# Patient Record
Sex: Female | Born: 1980 | Race: Black or African American | Hispanic: No | Marital: Married | Smoking: Never smoker
Health system: Southern US, Community
[De-identification: ages and names within clinical notes are randomized; demographics above are authoritative.]

## PROBLEM LIST (undated history)

## (undated) DIAGNOSIS — F419 Anxiety disorder, unspecified: Secondary | ICD-10-CM

## (undated) DIAGNOSIS — Z789 Other specified health status: Secondary | ICD-10-CM

## (undated) HISTORY — PX: TONSILLECTOMY: SUR1361

## (undated) HISTORY — PX: WISDOM TOOTH EXTRACTION: SHX21

---

## 1999-02-25 ENCOUNTER — Encounter: Payer: Self-pay | Admitting: Emergency Medicine

## 1999-02-25 ENCOUNTER — Emergency Department (HOSPITAL_COMMUNITY): Admission: EM | Admit: 1999-02-25 | Discharge: 1999-02-25 | Payer: Self-pay | Admitting: Emergency Medicine

## 2000-08-18 ENCOUNTER — Other Ambulatory Visit: Admission: RE | Admit: 2000-08-18 | Discharge: 2000-08-18 | Payer: Self-pay | Admitting: Family Medicine

## 2001-08-17 ENCOUNTER — Emergency Department (HOSPITAL_COMMUNITY): Admission: EM | Admit: 2001-08-17 | Discharge: 2001-08-17 | Payer: Self-pay | Admitting: Emergency Medicine

## 2004-04-23 ENCOUNTER — Ambulatory Visit: Payer: Self-pay | Admitting: Family Medicine

## 2007-02-02 ENCOUNTER — Encounter: Payer: Self-pay | Admitting: Family Medicine

## 2007-02-17 ENCOUNTER — Ambulatory Visit: Payer: Self-pay | Admitting: Family Medicine

## 2007-02-18 ENCOUNTER — Encounter: Payer: Self-pay | Admitting: Family Medicine

## 2007-02-18 LAB — CONVERTED CEMR LAB
BUN: 8 mg/dL (ref 6–23)
Basophils Absolute: 0 10*3/uL (ref 0.0–0.1)
Basophils Relative: 1 % (ref 0–1)
CO2: 22 meq/L (ref 19–32)
Calcium: 9.2 mg/dL (ref 8.4–10.5)
Chloride: 104 meq/L (ref 96–112)
Cholesterol: 149 mg/dL (ref 0–200)
Creatinine, Ser: 0.73 mg/dL (ref 0.40–1.20)
Eosinophils Absolute: 0.2 10*3/uL (ref 0.0–0.7)
Eosinophils Relative: 5 % (ref 0–5)
Glucose, Bld: 87 mg/dL (ref 70–99)
HCT: 40.9 % (ref 36.0–46.0)
HDL: 57 mg/dL (ref >39)
Hemoglobin: 13.7 g/dL (ref 12.0–15.0)
LDL Cholesterol: 84 mg/dL (ref 0–99)
Lymphocytes Relative: 53 % — ABNORMAL HIGH (ref 12–46)
Lymphs Abs: 2.1 10*3/uL (ref 0.7–4.0)
MCHC: 33.5 g/dL (ref 30.0–36.0)
MCV: 84.7 fL (ref 78.0–100.0)
Monocytes Absolute: 0.3 10*3/uL (ref 0.1–1.0)
Monocytes Relative: 8 % (ref 3–12)
Neutro Abs: 1.3 10*3/uL — ABNORMAL LOW (ref 1.7–7.7)
Neutrophils Relative %: 32 % — ABNORMAL LOW (ref 43–77)
Platelets: 245 10*3/uL (ref 150–400)
Potassium: 4.4 meq/L (ref 3.5–5.3)
RBC: 4.83 M/uL (ref 3.87–5.11)
RDW: 15.5 % (ref 11.5–15.5)
Sodium: 137 meq/L (ref 135–145)
Total CHOL/HDL Ratio: 2.6
Triglycerides: 39 mg/dL (ref <150)
VLDL: 8 mg/dL (ref 0–40)
WBC: 4 10*3/uL (ref 4.0–10.5)

## 2008-06-06 ENCOUNTER — Ambulatory Visit: Payer: Self-pay | Admitting: Family Medicine

## 2008-06-06 DIAGNOSIS — E669 Obesity, unspecified: Secondary | ICD-10-CM | POA: Insufficient documentation

## 2008-06-06 DIAGNOSIS — J019 Acute sinusitis, unspecified: Secondary | ICD-10-CM | POA: Insufficient documentation

## 2008-06-10 ENCOUNTER — Telehealth: Payer: Self-pay | Admitting: Family Medicine

## 2008-11-08 ENCOUNTER — Encounter: Payer: Self-pay | Admitting: Family Medicine

## 2008-11-12 ENCOUNTER — Other Ambulatory Visit: Payer: Self-pay | Admitting: Emergency Medicine

## 2008-11-12 ENCOUNTER — Inpatient Hospital Stay (HOSPITAL_COMMUNITY): Admission: AD | Admit: 2008-11-12 | Discharge: 2008-11-15 | Payer: Self-pay | Admitting: Obstetrics & Gynecology

## 2009-01-21 ENCOUNTER — Inpatient Hospital Stay (HOSPITAL_COMMUNITY): Admission: AD | Admit: 2009-01-21 | Discharge: 2009-01-21 | Payer: Self-pay | Admitting: Obstetrics and Gynecology

## 2009-02-02 ENCOUNTER — Inpatient Hospital Stay (HOSPITAL_COMMUNITY): Admission: AD | Admit: 2009-02-02 | Discharge: 2009-02-02 | Payer: Self-pay | Admitting: Obstetrics and Gynecology

## 2009-02-05 ENCOUNTER — Inpatient Hospital Stay (HOSPITAL_COMMUNITY): Admission: RE | Admit: 2009-02-05 | Discharge: 2009-02-07 | Payer: Self-pay | Admitting: Obstetrics & Gynecology

## 2010-02-15 ENCOUNTER — Encounter (HOSPITAL_COMMUNITY): Payer: Self-pay | Admitting: Obstetrics and Gynecology

## 2010-02-15 ENCOUNTER — Inpatient Hospital Stay (HOSPITAL_COMMUNITY)
Admission: AD | Admit: 2010-02-15 | Discharge: 2010-02-19 | Payer: Self-pay | Source: Home / Self Care | Attending: Obstetrics and Gynecology | Admitting: Obstetrics and Gynecology

## 2010-02-16 LAB — URINALYSIS, ROUTINE W REFLEX MICROSCOPIC
Bilirubin Urine: NEGATIVE
Ketones, ur: NEGATIVE mg/dL
Nitrite: NEGATIVE
Protein, ur: NEGATIVE mg/dL
Specific Gravity, Urine: 1.01 (ref 1.005–1.030)
Urine Glucose, Fasting: NEGATIVE mg/dL
Urobilinogen, UA: 0.2 mg/dL (ref 0.0–1.0)
pH: 7 (ref 5.0–8.0)

## 2010-02-16 LAB — CBC
HCT: 33.1 % — ABNORMAL LOW (ref 36.0–46.0)
Hemoglobin: 11.5 g/dL — ABNORMAL LOW (ref 12.0–15.0)
MCH: 30.3 pg (ref 26.0–34.0)
MCHC: 34.7 g/dL (ref 30.0–36.0)
MCV: 87.1 fL (ref 78.0–100.0)
Platelets: 222 10*3/uL (ref 150–400)
RBC: 3.8 MIL/uL — ABNORMAL LOW (ref 3.87–5.11)
RDW: 13.8 % (ref 11.5–15.5)
WBC: 9.2 10*3/uL (ref 4.0–10.5)

## 2010-02-16 LAB — URINE MICROSCOPIC-ADD ON

## 2010-02-16 LAB — RPR: RPR Ser Ql: NONREACTIVE

## 2010-02-18 LAB — CBC
HCT: 28.6 % — ABNORMAL LOW (ref 36.0–46.0)
HCT: 29.2 % — ABNORMAL LOW (ref 36.0–46.0)
Hemoglobin: 9.8 g/dL — ABNORMAL LOW (ref 12.0–15.0)
Hemoglobin: 9.8 g/dL — ABNORMAL LOW (ref 12.0–15.0)
MCH: 30.2 pg (ref 26.0–34.0)
MCH: 30.2 pg (ref 26.0–34.0)
MCHC: 34.3 g/dL (ref 30.0–36.0)
MCHC: 33.6 g/dL (ref 30.0–36.0)
MCV: 88 fL (ref 78.0–100.0)
MCV: 89.8 fL (ref 78.0–100.0)
Platelets: 213 10*3/uL (ref 150–400)
Platelets: 184 10*3/uL (ref 150–400)
RBC: 3.25 MIL/uL — ABNORMAL LOW (ref 3.87–5.11)
RBC: 3.25 MIL/uL — ABNORMAL LOW (ref 3.87–5.11)
RDW: 13.9 % (ref 11.5–15.5)
RDW: 14.2 % (ref 11.5–15.5)
WBC: 14.9 10*3/uL — ABNORMAL HIGH (ref 4.0–10.5)
WBC: 9.5 10*3/uL (ref 4.0–10.5)

## 2010-03-03 NOTE — Letter (Signed)
Summary: DEMO  DEMO   Imported By: Lind Guest 07/04/2009 08:26:27  _____________________________________________________________________  External Attachment:    Type:   Image     Comment:   External Document

## 2010-03-03 NOTE — Letter (Signed)
Summary: CONSULTS  CONSULTS   Imported By: Lind Guest 07/04/2009 08:25:44  _____________________________________________________________________  External Attachment:    Type:   Image     Comment:   External Document

## 2010-03-03 NOTE — Letter (Signed)
Summary: PHONE NOTES  PHONE NOTES   Imported By: Lind Guest 07/04/2009 08:27:59  _____________________________________________________________________  External Attachment:    Type:   Image     Comment:   External Document

## 2010-03-03 NOTE — Progress Notes (Signed)
Summary: medicine  Phone Note Call from Patient   Summary of Call: still no better. can doc call her in something else. [phone]  [facility] Initial call taken by: Rudene Anda,  Jun 10, 2008 4:14 PM  Follow-up for Phone Call        penicillin was prescribed for her sinuses, is she having green drainage, fever etc what are her symptoms.If she really feels as tho there is nO improvement an alternate abiotic eg septra ds Take 1 tablet by mouth two times a day #20 can be sent in , stop the pcn. she needs to submit sputum  for c/s also since she states no better.if she reports fever needs cbc and diff also I tried to speak with her but could get no answer Follow-up by: Syliva Overman MD,  Jun 10, 2008 6:16 PM  Additional Follow-up for Phone Call Additional follow up Details #1::        called patient, no answer Additional Follow-up by: Worthy Keeler LPN,  Jun 11, 2008 9:28 AM    Additional Follow-up for Phone Call Additional follow up Details #2::    really hoarse, chest congestion, cant get anything up, lungs hurt when she breathes, no fever Follow-up by: Worthy Keeler LPN,  Jun 11, 2008 1:58 PM  Additional Follow-up for Phone Call Additional follow up Details #3:: Details for Additional Follow-up Action Taken: [pls erx  and advise prednisone 5 mg dose pack x 6 days only and septra as previously discussed, offer cxr also pls Additional Follow-up by: Syliva Overman MD,  Jun 11, 2008 3:50 PM  New/Updated Medications: SEPTRA DS 800-160 MG TABS (SULFAMETHOXAZOLE-TRIMETHOPRIM) one tab by mouth bid PREDNISONE (PAK) 5 MG TABS (PREDNISONE) uad   Prescriptions: PREDNISONE (PAK) 5 MG TABS (PREDNISONE) uad  #21 x 0   Entered by:   Worthy Keeler LPN   Authorized by:   Syliva Overman MD   Signed by:   Worthy Keeler LPN on 51/88/4166   Method used:   Electronically to        [facility]  [address] (retail)       [address]       [place], [place]  [address]       Ph:  [phone]       Fax: [phone]   RxID:   [id number] SEPTRA DS 800-160 MG TABS (SULFAMETHOXAZOLE-TRIMETHOPRIM) one tab by mouth bid  #20 x 0   Entered by:   Worthy Keeler LPN   Authorized by:   Syliva Overman MD   Signed by:   Worthy Keeler LPN on 07/37/1062   Method used:   Electronically to        [facility]  [address] (retail)       [address]       [place], [place]  [address]       Ph: [phone]       Fax: [phone]   RxID:   [id number]  rx sent, called patient, left message  Appended Document: medicine called patient, left message

## 2010-03-03 NOTE — Letter (Signed)
Summary: MISC  MISC   Imported By: Lind Guest 07/04/2009 08:27:37  _____________________________________________________________________  External Attachment:    Type:   Image     Comment:   External Document

## 2010-03-03 NOTE — Letter (Signed)
Summary: OFFICE NOTES  OFFICE NOTES   Imported By: Lind Guest 07/04/2009 08:28:47  _____________________________________________________________________  External Attachment:    Type:   Image     Comment:   External Document

## 2010-03-03 NOTE — Letter (Signed)
Summary: Out of Work  All     ,     Phone:   Fax:     Jun 06, 2008   Employee:  [name]    To Whom It May Concern:   For Medical reasons, please excuse the above named employee from work for the following dates:  Start:   06/06/08  End:   06/10/08  If you need additional information, please feel free to contact our office.         Sincerely,    Worthy Keeler LPN

## 2010-03-03 NOTE — Letter (Signed)
Summary: HISTORY AND PHYSICAL  HISTORY AND PHYSICAL   Imported By: Lind Guest 07/04/2009 08:26:54  _____________________________________________________________________  External Attachment:    Type:   Image     Comment:   External Document

## 2010-03-03 NOTE — Letter (Signed)
Summary: LABS  LABS   Imported By: Lind Guest 07/04/2009 08:27:13  _____________________________________________________________________  External Attachment:    Type:   Image     Comment:   External Document

## 2010-03-03 NOTE — Letter (Signed)
Summary: Letter  Letter   Imported By: Lind Guest 11/08/2008 16:40:18  _____________________________________________________________________  External Attachment:    Type:   Image     Comment:   External Document

## 2010-03-04 NOTE — Op Note (Signed)
[name], [name]                 ACCOUNT NO.:  [id number]  MEDICAL RECORD NO.:  [id number]          PATIENT TYPE:  INP  LOCATION:  9170                          [facility]ILITY:  [facility]  PHYSICIAN:  Zelphia Cairo, MD    DATE OF BIRTH:  16-Mar-1980  DATE OF PROCEDURE:  02/15/2010 DATE OF DISCHARGE:                              OPERATIVE REPORT   PREOPERATIVE DIAGNOSES: 1. Twin gestation at 35 plus [redacted] weeks gestation. 2. Preterm labor with advanced dilation.  PROCEDURE:  Classical cesarean section.  ANESTHESIA:  Spinal.  SURGEON:  Zelphia Cairo, MD.  Threasa HeadsShawnie Pons.  ESTIMATED BLOOD LOSS:  800 mL.  URINE OUTPUT:  Clear.  FINDINGS:  Viable infant x2.  Apgars are still pending.  There was also a 3-4 cm pedunculated fibroid noted on the left anterior uterus.  Urine output was clear.  COMPLICATIONS:  None.  CONDITION:  Stable to recovery room.  PROCEDURE:  The patient was taken to the operating room after informed consent was obtained.  She was placed on her side and spinal anesthesia was obtained.  She was then turned to the supine position with a left tilt, prepped and draped in sterile fashion.  A Pfannenstiel skin incision was made with a scalpel and extended to the level of the fascia.  The fascia was incised sharply and this was extended laterally using curved Mayo scissors.  Kocher clamps were used to grasp the superior and inferior portion of the fascia.  This was tented upwards and the underlying rectus muscles were dissected sharply and bluntly. Peritoneum was then identified and entered sharply with Metzenbaum scissors.  This was extended superiorly and inferiorly with good visualization of the bladder.  The bladder blade was then inserted.  A classical uterine incision was made using a scalpel, A was noted to be in the vertex position.  Membranes were ruptured for clear fluids and the infant was delivered using fundal pressure.  The cord was clamped and cut as  the mouth and nose were suctioned and the infant was taken to the awaiting NICU staff.  Baby B amniotic membranes were ruptured for clear fluid.  Infant was noted to be in the transverse position, but rotated to vertex and delivered using fundal pressure.  The mouth and nose were suctioned as the cord was clamped and cut and the infant was taken to the awaiting NICU staff.  Placentas were then manually removed from the uterus and the uterus was cleared of all clots and debris using a dry lap sponge.  The uterine incision was reapproximated using double layer closure of 0 chromic in a running locked fashion.  Once hemostasis was assured, the pelvis was copiously irrigated with warm normal saline. The uterine incision was reinspected and found to be hemostatic.  The peritoneum was then reapproximated with 0 Monocryl.  The fascia was closed with a looped 0 PDS and the skin was closed with staples.  Sponge lap, needle, and instrument counts were correct x2.  She was taken to the recovery room in stable condition.     Zelphia Cairo, MD    GA/MEDQ  D:  02/15/2010  T:  02/16/2010  Job:  [id number]  Electronically Signed by Zelphia Cairo MD on 03/04/2010 08:12:03 PM

## 2010-03-17 NOTE — Discharge Summary (Addendum)
NAMEGAVIN, [name]                 ACCOUNT NO.:  [id number]  MEDICAL RECORD NO.:  [id number]          PATIENT TYPE:  INP  LOCATION:  9319                          [facility]ILITY:  [facility]  PHYSICIAN:  Dineen Kid. Rana Snare, M.D.    DATE OF BIRTH:  Apr 07, 1980  DATE OF ADMISSION:  02/15/2010 DATE OF DISCHARGE:  02/19/2010                              DISCHARGE SUMMARY   ADMITTING DIAGNOSES: 1. Intrauterine pregnancy at 63 and 6 weeks' estimated gestational     age. 2. Twin gestation. 3. Spontaneous onset of labor with advanced cervical dilatation.  DISCHARGE DIAGNOSES: 1. Status post classical cesarean section. 2. Viable female and female infants.  PROCEDURE:  Primary classical cesarean delivery.  REASON FOR ADMISSION:  Please see written H and P.  HOSPITAL COURSE:  The patient is 30 year old gravida 5, para 1-0-3-1, that was admitted to [facility] at 25-6/7th weeks' estimated gestational age with a twin gestation with spontaneous onset of labor with advanced cervical dilatation.  On admission, vital signs were stable.  Fetal heart tones were reactive.  Contractions were approximately every 2-3 minutes.  Cervix was examined and found to be 6- cm dilated and 90% effaced with -1 station.  Ultrasound revealed baby A with vertex with a cord presentation and baby B in the transverse presentation.  The patient was now admitted, placed in Trendelenburg position, betamethasone was administered, and the patient was started on magnesium sulfate.  The patient was continued on the magnesium sulfate. Ampicillin was also given for prophylaxis regarding group B beta strep. Later that morning, the patient did complain of a gush of amniotic fluid, no vaginal bleeding.  Fetal heart tones in the 120s-140s x2. Cervix was examined and found to be 8 cm with a bulging bag of waters, no cord prolapse.  Decision was made to proceed with a primary cesarean delivery.  The patient was then transferred to  the operating room where spinal anesthesia was administered without difficulty.  A classical incision was made with delivery of a viable female infant with Apgars of 6 at 1 minute, 1 at 5 minutes, 5 at 10 minutes, and baby B boy baby with Apgars of 7 at 1 minute, 7 at 5 minutes, and 9 at 10 minutes.  Babies were both taken to the NICU in stable condition.  The patient was also known to have a 3-4 cm pedunculated fibroid.  The patient tolerated the procedure well and was taken to the recovery room in stable condition. On postoperative day #1, the patient was without complaint.  Vital signs were stable.  Fundus was firm and nontender.  Abdominal dressing was noted to be clean, dry, and intact.  Laboratory findings showed hemoglobin of 9.8.  On postoperative day #2, babies were stable in the NICU.  Vital signs were stable.  Abdomen was soft.  Fundus was firm and nontender.  Incision was clean, dry, and intact.  On postoperative day #3, the patient was without complaint.  Vital signs remained stable. Abdomen was soft.  Fundus was firm and nontender.  She was ambulating well.  On postoperative day #3, the patient was without  complaint. Babies continued to be stable.  Abdomen was soft.  Fundus was firm and nontender.  Incision was clean, dry, and intact.  Staples were removed and the patient was later discharged home.  CONDITION ON DISCHARGE:  Stable.  DIET:  Regular as tolerated.  ACTIVITY:  No heavy lifting, no driving x2 weeks, no vaginal entry.  FOLLOWUP:  The patient is to follow up in the office in 1-2 weeks for an incision check.  She is to call for temperature greater than 100 degrees, persistent nausea, vomiting, heavy vaginal bleeding, and/or redness or drainage from incisional site.  DISCHARGE MEDICATIONS: 1. Percocet 5/325 #30, 1 p.o. every 4-6 hours p.r.n. 2. Motrin 600 mg every 6 hours. 3. Prenatal vitamins 1 p.o. daily. 4. Colace 1 p.o. daily p.r.n.     [name],  N.P.   ______________________________ Dineen Kid Rana Snare, M.D.    CC/MEDQ  D:  02/19/2010  T:  02/19/2010  Job:  [id number]  Electronically Signed by [name] N.P. on 02/20/2010 08:54:36 AM Electronically Signed by Candice Camp M.D. on 03/17/2010 12:59:33 PM

## 2010-04-19 LAB — CBC
HCT: 29.3 % — ABNORMAL LOW (ref 36.0–46.0)
HCT: 30.8 % — ABNORMAL LOW (ref 36.0–46.0)
HCT: 33 % — ABNORMAL LOW (ref 36.0–46.0)
Hemoglobin: 10.1 g/dL — ABNORMAL LOW (ref 12.0–15.0)
Hemoglobin: 10.9 g/dL — ABNORMAL LOW (ref 12.0–15.0)
Hemoglobin: 9.8 g/dL — ABNORMAL LOW (ref 12.0–15.0)
MCHC: 33 g/dL (ref 30.0–36.0)
MCHC: 33.1 g/dL (ref 30.0–36.0)
MCHC: 33.3 g/dL (ref 30.0–36.0)
MCV: 86.7 fL (ref 78.0–100.0)
MCV: 87.5 fL (ref 78.0–100.0)
MCV: 87.9 fL (ref 78.0–100.0)
Platelets: 140 10*3/uL — ABNORMAL LOW (ref 150–400)
Platelets: 141 10*3/uL — ABNORMAL LOW (ref 150–400)
Platelets: 168 10*3/uL (ref 150–400)
RBC: 3.35 MIL/uL — ABNORMAL LOW (ref 3.87–5.11)
RBC: 3.5 MIL/uL — ABNORMAL LOW (ref 3.87–5.11)
RBC: 3.81 MIL/uL — ABNORMAL LOW (ref 3.87–5.11)
RDW: 13.2 % (ref 11.5–15.5)
RDW: 13.4 % (ref 11.5–15.5)
RDW: 13.5 % (ref 11.5–15.5)
WBC: 15.2 10*3/uL — ABNORMAL HIGH (ref 4.0–10.5)
WBC: 22.7 10*3/uL — ABNORMAL HIGH (ref 4.0–10.5)
WBC: 5.1 10*3/uL (ref 4.0–10.5)

## 2010-04-19 LAB — RPR: RPR Ser Ql: NONREACTIVE

## 2010-05-07 LAB — CBC
HCT: 34.5 % — ABNORMAL LOW (ref 36.0–46.0)
Hemoglobin: 12 g/dL (ref 12.0–15.0)
MCHC: 34.7 g/dL (ref 30.0–36.0)
MCV: 92.3 fL (ref 78.0–100.0)
Platelets: 181 10*3/uL (ref 150–400)
RBC: 3.74 MIL/uL — ABNORMAL LOW (ref 3.87–5.11)
RDW: 13 % (ref 11.5–15.5)
WBC: 7.1 10*3/uL (ref 4.0–10.5)

## 2010-05-07 LAB — URINALYSIS, ROUTINE W REFLEX MICROSCOPIC
Bilirubin Urine: NEGATIVE
Glucose, UA: NEGATIVE mg/dL
Ketones, ur: NEGATIVE mg/dL
Nitrite: NEGATIVE
Protein, ur: NEGATIVE mg/dL
Specific Gravity, Urine: 1.015 (ref 1.005–1.030)
Urobilinogen, UA: 0.2 mg/dL (ref 0.0–1.0)
pH: 7 (ref 5.0–8.0)

## 2010-05-07 LAB — WET PREP, GENITAL
Clue Cells Wet Prep HPF POC: NONE SEEN
Trich, Wet Prep: NONE SEEN
WBC, Wet Prep HPF POC: NONE SEEN
Yeast Wet Prep HPF POC: NONE SEEN

## 2010-05-07 LAB — DIFFERENTIAL
Basophils Absolute: 0.1 10*3/uL (ref 0.0–0.1)
Basophils Relative: 1 % (ref 0–1)
Eosinophils Absolute: 0.1 10*3/uL (ref 0.0–0.7)
Eosinophils Relative: 1 % (ref 0–5)
Lymphocytes Relative: 35 % (ref 12–46)
Lymphs Abs: 2.5 10*3/uL (ref 0.7–4.0)
Monocytes Absolute: 0.5 10*3/uL (ref 0.1–1.0)
Monocytes Relative: 7 % (ref 3–12)
Neutro Abs: 4 10*3/uL (ref 1.7–7.7)
Neutrophils Relative %: 56 % (ref 43–77)

## 2010-05-07 LAB — BASIC METABOLIC PANEL
BUN: 5 mg/dL — ABNORMAL LOW (ref 6–23)
CO2: 24 mEq/L (ref 19–32)
Calcium: 9 mg/dL (ref 8.4–10.5)
Chloride: 109 mEq/L (ref 96–112)
Creatinine, Ser: 0.6 mg/dL (ref 0.4–1.2)
GFR calc Af Amer: >60 mL/min (ref >60)
GFR calc non Af Amer: >60 mL/min (ref >60)
Glucose, Bld: 82 mg/dL (ref 70–99)
Potassium: 3.3 mEq/L — ABNORMAL LOW (ref 3.5–5.1)
Sodium: 136 mEq/L (ref 135–145)

## 2010-05-07 LAB — GC/CHLAMYDIA PROBE AMP, GENITAL
Chlamydia, DNA Probe: NEGATIVE
GC Probe Amp, Genital: NEGATIVE

## 2010-05-07 LAB — URINE MICROSCOPIC-ADD ON

## 2010-05-18 ENCOUNTER — Encounter: Payer: Self-pay | Admitting: Orthopedic Surgery

## 2010-05-20 ENCOUNTER — Encounter: Payer: Self-pay | Admitting: Orthopedic Surgery

## 2010-05-20 ENCOUNTER — Ambulatory Visit: Payer: Self-pay | Admitting: Orthopedic Surgery

## 2011-02-02 HISTORY — PX: ABDOMINAL HYSTERECTOMY: SHX81

## 2011-11-08 ENCOUNTER — Inpatient Hospital Stay (HOSPITAL_COMMUNITY): Admission: RE | Admit: 2011-11-08 | Discharge: 2011-11-08 | Payer: Self-pay | Source: Ambulatory Visit

## 2011-11-30 ENCOUNTER — Encounter (HOSPITAL_COMMUNITY): Payer: Self-pay | Admitting: Pharmacist

## 2011-12-06 ENCOUNTER — Encounter (HOSPITAL_COMMUNITY): Payer: Self-pay

## 2011-12-06 ENCOUNTER — Encounter (HOSPITAL_COMMUNITY)
Admission: RE | Admit: 2011-12-06 | Discharge: 2011-12-06 | Disposition: A | Discharge status: Home / Self Care | Payer: BC Managed Care – PPO | Source: Ambulatory Visit | Attending: Obstetrics and Gynecology | Admitting: Obstetrics and Gynecology

## 2011-12-06 HISTORY — DX: Other specified health status: Z78.9

## 2011-12-06 LAB — CBC
HCT: 38.6 % (ref 36.0–46.0)
Hemoglobin: 13 g/dL (ref 12.0–15.0)
MCH: 29.4 pg (ref 26.0–34.0)
MCHC: 33.7 g/dL (ref 30.0–36.0)
MCV: 87.3 fL (ref 78.0–100.0)
Platelets: 265 10*3/uL (ref 150–400)
RBC: 4.42 MIL/uL (ref 3.87–5.11)
RDW: 13.8 % (ref 11.5–15.5)
WBC: 6.3 10*3/uL (ref 4.0–10.5)

## 2011-12-06 LAB — SURGICAL PCR SCREEN
MRSA, PCR: NEGATIVE
Staphylococcus aureus: NEGATIVE

## 2011-12-06 NOTE — Patient Instructions (Addendum)
Your procedure is scheduled on:12/13/11  Enter through the Main Entrance at :6am Pick up desk phone and dial [phone] and inform us of your arrival.  Please call [phone] if you have any problems the morning of surgery.  Remember: Do not eat or drink after midnight:Sunday   Take these meds the morning of surgery with a sip of water:none  DO NOT wear jewelry, eye make-up, lipstick,body lotion, or dark fingernail polish. Do not shave for 48 hours prior to surgery.  If you are to be admitted after surgery, leave suitcase in car until your room has been assigned.

## 2011-12-10 NOTE — H&P (Addendum)
[name] presents today for annual exam and also preop evaluation for hysterectomy.  [name] has been having worsening problems with menorrhagia, pelvic pain, abnormal bleeding.  She has a history of fibroids.  Her husband has had a vasectomy.  She had a recent ultrasound showing a very inhomogeneous uterus with multiple intramural fibroids and two thickened areas within the endometrial stripe near the fibroids.  She desires definitive surgical intervention and requests hysterectomy. O: Blood pressure 110/70.  Physical exam:  General:  Alert and oriented.  Normocephalic and atraumatic.  No thyromegaly or masses palpated.  Heart is regular rate and rhythm without murmur.  Lungs are clear to auscultation bilaterally.  Breasts without masses, lymphadenopathy or discharge.  Abdomen is soft, nontender, nondistended.  No rebound or guarding.  No flank pain is noted.  Extremities without clubbing, cyanosis or edema.  Normal external genitalia, Bartholin, Skene's and urethra.  Vagina without lesions.  Cervix within normal limits.  Uterus is approximately 8-week size, anteverted, mobile and nontender.  No adnexal masses are palpable.  No inguinal lymphadenopathy palpable.   A&P: Normal well woman exam.  Endometrial masses suspicious for polyps or fibroids.  Menorrhagia, fibroids, possible adenomyosis, pelvic pain.  Discussed multiple options with her for evaluation of this.  Both she and her husband want her to proceed with hysterectomy.  Discussed laparoscopically assisted vaginal hysterectomy with preservation of the ovaries versus possible TAH.  Discussed risks and benefits, pros and cons and the recovery period.  They do want to proceed.  She has no known drug allergies.  She has had a Cesarean section and a vaginal delivery.  Vaginal delivery was 8 pound, 11 ounce baby. Dineen Kid Rana Snare, MD/rad   12/13/11 0715 This patient has been seen and examined.   All of her questions were answered.  Labs and vital signs  reviewed.  Informed consent has been obtained.  The History and Physical is current.  DL

## 2011-12-12 MED ORDER — DEXTROSE 5 % IV SOLN
2.0000 g | INTRAVENOUS | Status: AC
  Administered 2011-12-13: 2 g via INTRAVENOUS
  Filled 2011-12-12: qty 2

## 2011-12-12 NOTE — Anesthesia Preprocedure Evaluation (Addendum)
Anesthesia Evaluation  Patient identified by MRN, date of birth, ID band Patient awake    Reviewed: Allergy & Precautions, H&P , NPO status , Patient's Chart, lab work & pertinent test results  Airway Mallampati: III TM Distance: >3 FB Neck ROM: Full    Dental No notable dental hx. (+) Teeth Intact   Pulmonary neg pulmonary ROS,  breath sounds clear to auscultation- rhonchi  Pulmonary exam normal       Cardiovascular negative cardio ROS  Rhythm:Regular Rate:Normal     Neuro/Psych negative neurological ROS  negative psych ROS   GI/Hepatic negative GI ROS, Neg liver ROS,   Endo/Other  negative endocrine ROSObesity  Renal/GU negative Renal ROS  negative genitourinary   Musculoskeletal negative musculoskeletal ROS (+)   Abdominal Normal abdominal exam  (+)   Peds  Hematology negative hematology ROS (+)   Anesthesia Other Findings   Reproductive/Obstetrics Menorrhagia Endometrial Mass                          Anesthesia Physical Anesthesia Plan  ASA: II  Anesthesia Plan: General   Post-op Pain Management:    Induction: Intravenous  Airway Management Planned: Oral ETT  Additional Equipment:   Intra-op Plan:   Post-operative Plan: Extubation in OR  Informed Consent: I have reviewed the patients History and Physical, chart, labs and discussed the procedure including the risks, benefits and alternatives for the proposed anesthesia with the patient or authorized representative who has indicated his/her understanding and acceptance.   Dental advisory given  Plan Discussed with: CRNA, Anesthesiologist and Surgeon  Anesthesia Plan Comments:         Anesthesia Quick Evaluation

## 2011-12-13 ENCOUNTER — Encounter (HOSPITAL_COMMUNITY): Payer: Self-pay | Admitting: *Deleted

## 2011-12-13 ENCOUNTER — Encounter (HOSPITAL_COMMUNITY): Payer: Self-pay | Admitting: Anesthesiology

## 2011-12-13 ENCOUNTER — Encounter (HOSPITAL_COMMUNITY)
Admission: RE | Disposition: A | Discharge status: Home / Self Care | Payer: Self-pay | Source: Ambulatory Visit | Attending: Obstetrics and Gynecology

## 2011-12-13 ENCOUNTER — Ambulatory Visit (HOSPITAL_COMMUNITY): Payer: BC Managed Care – PPO | Admitting: Anesthesiology

## 2011-12-13 ENCOUNTER — Observation Stay (HOSPITAL_COMMUNITY)
Admission: RE | Admit: 2011-12-13 | Discharge: 2011-12-14 | Disposition: A | Discharge status: Home / Self Care | Payer: BC Managed Care – PPO | Source: Ambulatory Visit | Attending: Obstetrics and Gynecology | Admitting: Obstetrics and Gynecology

## 2011-12-13 DIAGNOSIS — Z90710 Acquired absence of both cervix and uterus: Secondary | ICD-10-CM

## 2011-12-13 DIAGNOSIS — N83209 Unspecified ovarian cyst, unspecified side: Secondary | ICD-10-CM | POA: Insufficient documentation

## 2011-12-13 DIAGNOSIS — D251 Intramural leiomyoma of uterus: Secondary | ICD-10-CM | POA: Insufficient documentation

## 2011-12-13 DIAGNOSIS — N949 Unspecified condition associated with female genital organs and menstrual cycle: Secondary | ICD-10-CM | POA: Insufficient documentation

## 2011-12-13 DIAGNOSIS — N8000 Endometriosis of the uterus, unspecified: Secondary | ICD-10-CM | POA: Insufficient documentation

## 2011-12-13 DIAGNOSIS — N920 Excessive and frequent menstruation with regular cycle: Principal | ICD-10-CM | POA: Insufficient documentation

## 2011-12-13 HISTORY — PX: LAPAROSCOPIC ASSISTED VAGINAL HYSTERECTOMY: SHX5398

## 2011-12-13 SURGERY — HYSTERECTOMY, VAGINAL, LAPAROSCOPY-ASSISTED
Anesthesia: General | Wound class: Clean Contaminated

## 2011-12-13 MED ORDER — HYDROMORPHONE HCL PF 1 MG/ML IJ SOLN
INTRAMUSCULAR | Status: AC
  Filled 2011-12-13: qty 1

## 2011-12-13 MED ORDER — MIDAZOLAM HCL 5 MG/5ML IJ SOLN
INTRAMUSCULAR | Status: DC | PRN
  Administered 2011-12-13: 2 mg via INTRAVENOUS

## 2011-12-13 MED ORDER — METOCLOPRAMIDE HCL 5 MG/ML IJ SOLN: 10.0000 mg | Freq: Once | INTRAMUSCULAR | Status: DC | PRN

## 2011-12-13 MED ORDER — PROPOFOL 10 MG/ML IV BOLUS
INTRAVENOUS | Status: DC | PRN
  Administered 2011-12-13: 100 mg via INTRAVENOUS
  Administered 2011-12-13: 50 mg via INTRAVENOUS
  Administered 2011-12-13: 150 mg via INTRAVENOUS

## 2011-12-13 MED ORDER — OXYCODONE-ACETAMINOPHEN 5-325 MG PO TABS
1.0000 | ORAL_TABLET | ORAL | Status: DC | PRN
  Administered 2011-12-13 (×2): 2 via ORAL
  Administered 2011-12-14: 1 via ORAL
  Administered 2011-12-14: 2 via ORAL
  Filled 2011-12-13 (×2): qty 2
  Filled 2011-12-13: qty 1
  Filled 2011-12-13: qty 2

## 2011-12-13 MED ORDER — BUPIVACAINE-EPINEPHRINE PF 0.25-1:200000 % IJ SOLN
INTRAMUSCULAR | Status: AC
  Filled 2011-12-13: qty 30

## 2011-12-13 MED ORDER — ONDANSETRON HCL 4 MG/2ML IJ SOLN
4.0000 mg | Freq: Four times a day (QID) | INTRAMUSCULAR | Status: DC | PRN
  Administered 2011-12-13: 4 mg via INTRAVENOUS
  Filled 2011-12-13: qty 2

## 2011-12-13 MED ORDER — NEOSTIGMINE METHYLSULFATE 1 MG/ML IJ SOLN
INTRAMUSCULAR | Status: DC | PRN
  Administered 2011-12-13: 2 mg via INTRAVENOUS

## 2011-12-13 MED ORDER — ONDANSETRON HCL 4 MG/2ML IJ SOLN
INTRAMUSCULAR | Status: DC | PRN
  Administered 2011-12-13: 4 mg via INTRAVENOUS

## 2011-12-13 MED ORDER — DEXTROSE-NACL 5-0.45 % IV SOLN
INTRAVENOUS | Status: DC
  Administered 2011-12-13 – 2011-12-14 (×2): via INTRAVENOUS

## 2011-12-13 MED ORDER — NEOSTIGMINE METHYLSULFATE 1 MG/ML IJ SOLN
INTRAMUSCULAR | Status: AC
  Filled 2011-12-13: qty 10

## 2011-12-13 MED ORDER — DIPHENHYDRAMINE HCL 12.5 MG/5ML PO ELIX
12.5000 mg | ORAL_SOLUTION | Freq: Four times a day (QID) | ORAL | Status: DC | PRN
  Filled 2011-12-13: qty 5

## 2011-12-13 MED ORDER — LIDOCAINE HCL (CARDIAC) 20 MG/ML IV SOLN
INTRAVENOUS | Status: DC | PRN
  Administered 2011-12-13: 60 mg via INTRAVENOUS

## 2011-12-13 MED ORDER — SODIUM CHLORIDE 0.9 % IJ SOLN: 9.0000 mL | INTRAMUSCULAR | Status: DC | PRN

## 2011-12-13 MED ORDER — FENTANYL CITRATE 0.05 MG/ML IJ SOLN
INTRAMUSCULAR | Status: AC
  Filled 2011-12-13: qty 5

## 2011-12-13 MED ORDER — ONDANSETRON HCL 4 MG/2ML IJ SOLN
INTRAMUSCULAR | Status: AC
  Filled 2011-12-13: qty 2

## 2011-12-13 MED ORDER — DIPHENHYDRAMINE HCL 50 MG/ML IJ SOLN
12.5000 mg | Freq: Four times a day (QID) | INTRAMUSCULAR | Status: DC | PRN
  Administered 2011-12-13: 12.5 mg via INTRAVENOUS
  Filled 2011-12-13: qty 1

## 2011-12-13 MED ORDER — GLYCOPYRROLATE 0.2 MG/ML IJ SOLN
INTRAMUSCULAR | Status: DC | PRN
  Administered 2011-12-13: 0.4 mg via INTRAVENOUS
  Administered 2011-12-13: 0.1 mg via INTRAVENOUS

## 2011-12-13 MED ORDER — DEXAMETHASONE SODIUM PHOSPHATE 10 MG/ML IJ SOLN
INTRAMUSCULAR | Status: DC | PRN
  Administered 2011-12-13: 10 mg via INTRAVENOUS

## 2011-12-13 MED ORDER — MIDAZOLAM HCL 2 MG/2ML IJ SOLN
INTRAMUSCULAR | Status: AC
  Filled 2011-12-13: qty 2

## 2011-12-13 MED ORDER — DIPHENHYDRAMINE HCL 25 MG PO CAPS
25.0000 mg | ORAL_CAPSULE | Freq: Four times a day (QID) | ORAL | Status: DC | PRN
  Administered 2011-12-13: 25 mg via ORAL
  Filled 2011-12-13: qty 1

## 2011-12-13 MED ORDER — LACTATED RINGERS IV SOLN
INTRAVENOUS | Status: DC
  Administered 2011-12-13: 125 mL/h via INTRAVENOUS
  Administered 2011-12-13 (×2): via INTRAVENOUS

## 2011-12-13 MED ORDER — INFLUENZA VIRUS VACC SPLIT PF IM SUSP
0.5000 mL | INTRAMUSCULAR | Status: AC
  Administered 2011-12-14: 0.5 mL via INTRAMUSCULAR
  Filled 2011-12-13: qty 0.5

## 2011-12-13 MED ORDER — MEPERIDINE HCL 25 MG/ML IJ SOLN: 6.2500 mg | INTRAMUSCULAR | Status: DC | PRN

## 2011-12-13 MED ORDER — MENTHOL 3 MG MT LOZG
1.0000 | LOZENGE | OROMUCOSAL | Status: DC | PRN
  Filled 2011-12-13: qty 9

## 2011-12-13 MED ORDER — SCOPOLAMINE 1 MG/3DAYS TD PT72
1.0000 | MEDICATED_PATCH | TRANSDERMAL | Status: DC
  Administered 2011-12-13: 1.5 mg via TRANSDERMAL

## 2011-12-13 MED ORDER — SCOPOLAMINE 1 MG/3DAYS TD PT72: 1.0000 | MEDICATED_PATCH | Freq: Once | TRANSDERMAL | Status: DC

## 2011-12-13 MED ORDER — ZOLPIDEM TARTRATE 5 MG PO TABS: 5.0000 mg | ORAL_TABLET | Freq: Every evening | ORAL | Status: DC | PRN

## 2011-12-13 MED ORDER — HYDROMORPHONE 0.3 MG/ML IV SOLN
INTRAVENOUS | Status: DC
  Administered 2011-12-13: 0.6 mg via INTRAVENOUS
  Administered 2011-12-13: 0.599 mg via INTRAVENOUS
  Administered 2011-12-13: 11:00:00 via INTRAVENOUS
  Filled 2011-12-13: qty 25

## 2011-12-13 MED ORDER — ROCURONIUM BROMIDE 50 MG/5ML IV SOLN
INTRAVENOUS | Status: AC
  Filled 2011-12-13: qty 1

## 2011-12-13 MED ORDER — GLYCOPYRROLATE 0.2 MG/ML IJ SOLN
INTRAMUSCULAR | Status: AC
  Filled 2011-12-13: qty 1

## 2011-12-13 MED ORDER — GLYCOPYRROLATE 0.2 MG/ML IJ SOLN
INTRAMUSCULAR | Status: AC
  Filled 2011-12-13: qty 2

## 2011-12-13 MED ORDER — PROPOFOL 10 MG/ML IV EMUL
INTRAVENOUS | Status: AC
  Filled 2011-12-13: qty 20

## 2011-12-13 MED ORDER — HYDROMORPHONE HCL PF 1 MG/ML IJ SOLN
INTRAMUSCULAR | Status: DC | PRN
  Administered 2011-12-13: 1 mg via INTRAVENOUS

## 2011-12-13 MED ORDER — ROCURONIUM BROMIDE 100 MG/10ML IV SOLN
INTRAVENOUS | Status: DC | PRN
  Administered 2011-12-13: 40 mg via INTRAVENOUS

## 2011-12-13 MED ORDER — IBUPROFEN 600 MG PO TABS
600.0000 mg | ORAL_TABLET | Freq: Four times a day (QID) | ORAL | Status: DC | PRN
  Administered 2011-12-13 – 2011-12-14 (×3): 600 mg via ORAL
  Filled 2011-12-13 (×3): qty 1

## 2011-12-13 MED ORDER — SCOPOLAMINE 1 MG/3DAYS TD PT72
MEDICATED_PATCH | TRANSDERMAL | Status: AC
  Administered 2011-12-13: 1.5 mg via TRANSDERMAL
  Filled 2011-12-13: qty 1

## 2011-12-13 MED ORDER — HYDROMORPHONE HCL PF 1 MG/ML IJ SOLN
0.2500 mg | INTRAMUSCULAR | Status: DC | PRN
  Administered 2011-12-13: 0.5 mg via INTRAVENOUS
  Administered 2011-12-13 (×2): 0.25 mg via INTRAVENOUS

## 2011-12-13 MED ORDER — HYDROMORPHONE HCL PF 1 MG/ML IJ SOLN: 0.2000 mg | INTRAMUSCULAR | Status: DC | PRN

## 2011-12-13 MED ORDER — NALOXONE HCL 0.4 MG/ML IJ SOLN: 0.4000 mg | INTRAMUSCULAR | Status: DC | PRN

## 2011-12-13 MED ORDER — DEXAMETHASONE SODIUM PHOSPHATE 10 MG/ML IJ SOLN
INTRAMUSCULAR | Status: AC
  Filled 2011-12-13: qty 1

## 2011-12-13 MED ORDER — LIDOCAINE HCL (CARDIAC) 20 MG/ML IV SOLN
INTRAVENOUS | Status: AC
  Filled 2011-12-13: qty 5

## 2011-12-13 MED ORDER — FENTANYL CITRATE 0.05 MG/ML IJ SOLN
INTRAMUSCULAR | Status: DC | PRN
  Administered 2011-12-13: 50 ug via INTRAVENOUS
  Administered 2011-12-13: 100 ug via INTRAVENOUS

## 2011-12-13 SURGICAL SUPPLY — 40 items
BLADE SURG 15 STRL LF C SS BP (BLADE) ×1 IMPLANT
BLADE SURG 15 STRL SS (BLADE) ×1
CABLE HIGH FREQUENCY MONO STRZ (ELECTRODE) IMPLANT
CATH ROBINSON RED A/P 16FR (CATHETERS) ×2 IMPLANT
CLOTH BEACON ORANGE TIMEOUT ST (SAFETY) ×2 IMPLANT
CONT PATH 16OZ SNAP LID 3702 (MISCELLANEOUS) ×2 IMPLANT
COVER TABLE BACK 60X90 (DRAPES) ×2 IMPLANT
DECANTER SPIKE VIAL GLASS SM (MISCELLANEOUS) IMPLANT
DERMABOND ADVANCED (GAUZE/BANDAGES/DRESSINGS) ×1
DERMABOND ADVANCED .7 DNX12 (GAUZE/BANDAGES/DRESSINGS) ×1 IMPLANT
ELECT LIGASURE LONG (ELECTRODE) ×2 IMPLANT
ELECT REM PT RETURN 9FT ADLT (ELECTROSURGICAL)
ELECTRODE REM PT RTRN 9FT ADLT (ELECTROSURGICAL) IMPLANT
FORCEPS CUTTING 45CM 5MM (CUTTING FORCEPS) ×2 IMPLANT
GLOVE BIO SURGEON STRL SZ8 (GLOVE) ×2 IMPLANT
GLOVE BIOGEL PI IND STRL 6.5 (GLOVE) ×1 IMPLANT
GLOVE BIOGEL PI INDICATOR 6.5 (GLOVE) ×1
GLOVE SURG ORTHO 8.0 STRL STRW (GLOVE) ×6 IMPLANT
GOWN PREVENTION PLUS LG XLONG (DISPOSABLE) ×6 IMPLANT
NEEDLE INSUFFLATION 14GA 120MM (NEEDLE) ×2 IMPLANT
NS IRRIG 1000ML POUR BTL (IV SOLUTION) ×2 IMPLANT
PACK LAVH (CUSTOM PROCEDURE TRAY) ×2 IMPLANT
PROTECTOR NERVE ULNAR (MISCELLANEOUS) ×2 IMPLANT
SET IRRIG TUBING LAPAROSCOPIC (IRRIGATION / IRRIGATOR) ×2 IMPLANT
SOLUTION ELECTROLUBE (MISCELLANEOUS) IMPLANT
STRIP CLOSURE SKIN 1/4X3 (GAUZE/BANDAGES/DRESSINGS) IMPLANT
SUT MNCRL 0 MO-4 VIOLET 18 CR (SUTURE) ×2 IMPLANT
SUT MNCRL 0 VIOLET 6X18 (SUTURE) ×1 IMPLANT
SUT MNCRL AB 0 CT1 27 (SUTURE) IMPLANT
SUT MON AB 2-0 CT1 36 (SUTURE) IMPLANT
SUT MONOCRYL 0 6X18 (SUTURE) ×1
SUT MONOCRYL 0 MO 4 18  CR/8 (SUTURE) ×2
SUT VICRYL 0 UR6 27IN ABS (SUTURE) ×2 IMPLANT
SUT VICRYL RAPIDE 3 0 (SUTURE) ×2 IMPLANT
TOWEL OR 17X24 6PK STRL BLUE (TOWEL DISPOSABLE) ×4 IMPLANT
TRAY FOLEY CATH 14FR (SET/KITS/TRAYS/PACK) ×2 IMPLANT
TROCAR Z-THREAD BLADED 11X100M (TROCAR) ×2 IMPLANT
TROCAR Z-THREAD BLADED 5X100MM (TROCAR) ×2 IMPLANT
WARMER LAPAROSCOPE (MISCELLANEOUS) ×2 IMPLANT
WATER STERILE IRR 1000ML POUR (IV SOLUTION) ×2 IMPLANT

## 2011-12-13 NOTE — Anesthesia Procedure Notes (Signed)
Procedure Name: Intubation Date/Time: 12/13/2011 7:44 AM Performed by: Graciela Husbands Pre-anesthesia Checklist: Suction available, Emergency Drugs available, Timeout performed, Patient identified and Patient being monitored Patient Re-evaluated:Patient Re-evaluated prior to inductionOxygen Delivery Method: Circle system utilized Preoxygenation: Pre-oxygenation with 100% oxygen Intubation Type: IV induction Ventilation: Oral airway inserted - appropriate to patient size and Mask ventilation without difficulty Laryngoscope Size: Mac and 4 Grade View: Grade II Tube size: 7.0 mm Number of attempts: 1 Airway Equipment and Method: Patient positioned with wedge pillow and Stylet Secured at: 22 cm Tube secured with: Tape Dental Injury: Teeth and Oropharynx as per pre-operative assessment  Difficulty Due To: Difficulty was anticipated

## 2011-12-13 NOTE — Brief Op Note (Addendum)
12/13/2011  9:07 AM  PATIENT:  [name]  31 y.o. female  PRE-OPERATIVE DIAGNOSIS:  Menorrhagia; fibroids, pelvic pain  POST-OPERATIVE DIAGNOSIS:  Menorrhagia;fibroids, pelvic pain, left ovarian cyst, pelvic adhesions  PROCEDURE:  Procedure(s) (LRB) with comments: LAPAROSCOPIC ASSISTED VAGINAL HYSTERECTOMY (N/A), left ovarian cystectomy, LOA  SURGEON:  Surgeon(s) and Role:    * Turner Daniels, MD - Primary    * Mitchel Honour, DO - Assisting  PHYSICIAN ASSISTANT:   ASSISTANTS: none   ANESTHESIA:   general  EBL:  Total I/O In: 2000 [I.V.:2000] Out: 300 [Blood:300]  BLOOD ADMINISTERED:none  DRAINS: Urinary Catheter (Foley)   LOCAL MEDICATIONS USED:  MARCAINE     SPECIMEN:  Source of Specimen:  uterus  DISPOSITION OF SPECIMEN:  PATHOLOGY  COUNTS:  YES  TOURNIQUET:  * No tourniquets in log *  DICTATION: .Other Dictation: Dictation Number  [id number]  PLAN OF CARE: Admit for overnight observation  PATIENT DISPOSITION:  PACU - hemodynamically stable.   Delay start of Pharmacological VTE agent (>24hrs) due to surgical blood loss or risk of bleeding: not applicable

## 2011-12-13 NOTE — Op Note (Signed)
[name], [name]                 ACCOUNT NO.:  [id number]  MEDICAL RECORD NO.:  [id number]  LOCATION:  9302                          [facility]ILITY:  [facility]  PHYSICIAN:  Dineen Kid. Rana Snare, M.D.    DATE OF BIRTH:  28-Sep-1980  DATE OF PROCEDURE:  12/13/2011 DATE OF DISCHARGE:                              OPERATIVE REPORT   PREOPERATIVE DIAGNOSES:  Menorrhagia, fibroids, possible adenomyosis, pelvic pain.  POSTOPERATIVE DIAGNOSES:  Menorrhagia, fibroids, possible adenomyosis, pelvic pain plus pelvic adhesions and left ovarian cyst.  SURGEON:  Dineen Kid. Rana Snare, MD  ASSISTANT:  Dr. Gillian Scarce.  ANESTHESIA:  General endotracheal.  PROCEDURES:  Laparoscopic-assisted vaginal hysterectomy with lysis of adhesions and left ovarian cystectomy.  INDICATIONS:  [name] is a 30 year old, status post cesarean section for twins and a vaginal delivery.  No further childbearing desires.  Her husband had a vasectomy.  She has been having worsening problems of menorrhagia, pelvic pain, abnormal bleeding, history of fibroids.  She desires definitive surgical intervention and requests laparoscopic- assisted vaginal hysterectomy.  The risks and benefits of procedure were discussed at length.  Informed consent was obtained.  FINDINGS AT TIME OF SURGERY:  Irregular 8-10 week size uterus, adhesions to the anterior abdominal wall to the uterus, 4-5 cm left ovarian cyst which appeared to be hemorrhagic corpus luteum.  Otherwise normal- appearing appendix and liver and ovaries.  DESCRIPTION OF PROCEDURE:  After adequate analgesia, the patient was placed in dorsal lithotomy position.  She was sterilely prepped and draped.  Bladder sterilely drained.  A Hulka tenaculum was placed on the cervix.  A 1 cm infraumbilical skin incision was made.  A Veress needle was inserted.  The abdomen was insufflated with dullness to percussion. An 11 mm trocar was inserted.  The above findings were noted by laparoscope.  A 5 mm  trocar was inserted left of the midline 2 fingerbreadths above the pubic symphysis.  After careful and systematic evaluation of the abdomen and pelvis, the right and left utero-ovarian ligaments were dissected using Gyrus cutting forceps.  With good hemostasis achieved, the left tube and ovary and right tube and ovary felt lateral to the uterus with care taken to avoid the underlying ureter.  The adhesions from the anterior abdominal wall were sharply dissected using Gyrus cutting forceps down to the level of the bladder flap.  The bladder was then elevated and a small window was made at the uterovesical junction creating a small bladder flap.  Legs were repositioned.  Weighted speculum was placed, posterior colpotomy was performed.  The cervix was then circumscribed with Bovie cautery. LigaSure instrument was used to ligate across the uterosacral ligaments bilaterally, cardinal ligaments and bladder pillars bilaterally, dissected with Mayo scissors.  The anterior vaginal mucosa was then dissected off the anterior surface of the cervix.  The anterior peritoneum was entered sharply and Deaver retractor placed underneath the bladder.  The inferior portions of the broad ligament including the uterine vessels were then ligated using LigaSure instruments and Mayo scissors.  Dissection of the uterus was then removed and passed off the field.  The right and left uterosacral ligaments were identified, ligated with a figure-of-eight of 0  Monocryl suture.  The posterior peritoneum was then closed in pursestring fashion using 0 Monocryl suture.  The vagina was then closed in a vertical fashion using figure- of-eights of 0 Monocryl suture, plicating the uterosacral ligaments in the midline with good hemostasis achieved and good support of the vaginal cuff noted.  The bladder was then drained with a Foley catheter with clear yellow urine noted.  Legs were repositioned.  Abdomen was reinsufflated and a  Nezhat suction irrigator was used to irrigate the abdomen and pelvis.  Small peritoneal bleeders were cauterized using bipolar cautery.  Reexamination of the ovaries revealed good hemostasis. The left hemorrhagic corpus luteal cyst was then opened using the Gyrus cutting forceps.  The base of the cyst was then cauterized with bipolar cautery.  Irrigation of the ovary and the ovarian cyst wall was carried out with good hemostasis achieved and resolution of the cyst with the cyst wall cauterized.  Both ureters were identified bilaterally well away from the dissection and good peristalsis was noted.  The abdomen was then desufflated.  Trocars removed.  The infraumbilical skin incision was closed with 0 Vicryl interrupted suture and the fascia 3-0 Vicryl Rapide subcuticular suture.  The 5 mm site was then closed with a 3-0 Vicryl Rapide and Dermabond.  The incisions were injected with 0.25% Marcaine, total 10 mL used.  The patient was stable and transferred to recovery room.  Sponge and instrument count was normal x3.  ESTIMATED BLOOD LOSS:  300 mL.  The patient received 2 g of cefotetan preoperatively.     Dineen Kid Rana Snare, M.D.     DCL/MEDQ  D:  12/13/2011  T:  12/13/2011  Job:  [id number]

## 2011-12-13 NOTE — Transfer of Care (Signed)
Immediate Anesthesia Transfer of Care Note  Patient: [name]  Procedure(s) Performed: Procedure(s) (LRB) with comments: LAPAROSCOPIC ASSISTED VAGINAL HYSTERECTOMY (N/A)  Patient Location: PACU  Anesthesia Type:General  Level of Consciousness: awake, alert  and oriented  Airway & Oxygen Therapy: Patient Spontanous Breathing and Patient connected to nasal cannula oxygen  Post-op Assessment: Report given to PACU RN and Post -op Vital signs reviewed and stable  Post vital signs: Reviewed and stable  Complications: No apparent anesthesia complications

## 2011-12-13 NOTE — Anesthesia Postprocedure Evaluation (Signed)
  Anesthesia Post-op Note  Patient: [name]  Procedure(s) Performed: Procedure(s) (LRB) with comments: LAPAROSCOPIC ASSISTED VAGINAL HYSTERECTOMY (N/A)  Patient Location: [facility]  Anesthesia Type:General  Level of Consciousness: awake, alert  and oriented  Airway and Oxygen Therapy: Patient Spontanous Breathing and Patient connected to nasal cannula oxygen  Post-op Pain: mild  Post-op Assessment: Post-op Vital signs reviewed, Patient's Cardiovascular Status Stable and Pain level controlled  Post-op Vital Signs: Reviewed and stable  Complications: No apparent anesthesia complications

## 2011-12-13 NOTE — Anesthesia Postprocedure Evaluation (Signed)
Anesthesia Post Note  Patient: [name]  Procedure(s) Performed: Procedure(s) (LRB): LAPAROSCOPIC ASSISTED VAGINAL HYSTERECTOMY (N/A)  Anesthesia type: General  Patient location: PACU  Post pain: Pain level controlled  Post assessment: Post-op Vital signs reviewed  Last Vitals:  Filed Vitals:   12/13/11 1130  BP: 128/65  Pulse: 85  Temp: 36.6 C  Resp: 14    Post vital signs: Reviewed  Level of consciousness: sedated  Complications: No apparent anesthesia complicationsfj

## 2011-12-13 NOTE — Addendum Note (Signed)
Addendum  created 12/13/11 1610 by Graciela Husbands, CRNA   Modules edited:Notes Section

## 2011-12-14 ENCOUNTER — Encounter (HOSPITAL_COMMUNITY): Payer: Self-pay | Admitting: Obstetrics and Gynecology

## 2011-12-14 LAB — CBC
HCT: 34.6 % — ABNORMAL LOW (ref 36.0–46.0)
Hemoglobin: 11.5 g/dL — ABNORMAL LOW (ref 12.0–15.0)
MCH: 29.3 pg (ref 26.0–34.0)
MCHC: 33.2 g/dL (ref 30.0–36.0)
MCV: 88 fL (ref 78.0–100.0)
Platelets: 225 10*3/uL (ref 150–400)
RBC: 3.93 MIL/uL (ref 3.87–5.11)
RDW: 13.6 % (ref 11.5–15.5)
WBC: 9 10*3/uL (ref 4.0–10.5)

## 2011-12-14 MED ORDER — IBUPROFEN 600 MG PO TABS: 600.0000 mg | ORAL_TABLET | Freq: Four times a day (QID) | ORAL | Status: DC | PRN

## 2011-12-14 MED ORDER — OXYCODONE-ACETAMINOPHEN 5-325 MG PO TABS: 1.0000 | ORAL_TABLET | ORAL | Status: DC | PRN

## 2011-12-14 NOTE — Progress Notes (Signed)
Pt discharged to home with husband.  Condition stable.  Pt ambulated to car with NT.  No equipment for home ordered at discharge. 

## 2011-12-14 NOTE — Progress Notes (Signed)
1 Day Post-Op Procedure(s) (LRB): LAPAROSCOPIC ASSISTED VAGINAL HYSTERECTOMY (N/A)  Subjective: Patient reports tolerating PO, + flatus and no problems voiding.    Objective: I have reviewed patient's vital signs, intake and output, medications and labs.  General: alert, cooperative, appears stated age and no distress GI: soft, non-tender; bowel sounds normal; no masses,  no organomegaly Vaginal Bleeding: none  Assessment: s/p Procedure(s) (LRB) with comments: LAPAROSCOPIC ASSISTED VAGINAL HYSTERECTOMY (N/A): stable, progressing well and tolerating diet  Plan: Encourage ambulation Advance to PO medication Discharge home  LOS: 1 day    Mithcell Schumpert C 12/14/2011, 9:06 AM

## 2011-12-14 NOTE — Discharge Summary (Signed)
Physician Discharge Summary  Patient ID: [name] MRN: [id number] DOB/AGE: 10/10/1980 31 y.o.  Admit date: 12/13/2011 Discharge date: 12/14/2011  Admission Diagnoses:  Discharge Diagnoses:  Active Problems:  * No active hospital problems. *    Discharged Condition: good  Hospital Course: Pt underwent umcomplicated LAVH with LOA and left ovarian cystectomy.  Her EBL was 250cc and post op course unremarkable with good return of bowel function, tolerating po well.  Postop Hgb 11.5 and patient desired D/C home on POD#1.    Consults: None  Significant Diagnostic Studies: labs: 11.5  Treatments: IV hydration and surgery: LAVH  Discharge Exam: Blood pressure 109/67, pulse 86, temperature 98.2 F (36.8 C), temperature source Oral, resp. rate 18, height 5\' 6"  (1.676 m), weight 90.719 kg (200 lb), last menstrual period 11/21/2011, SpO2 100.00%. General appearance: alert, cooperative, appears stated age and no distress GI: soft, non-tender; bowel sounds normal; no masses,  no organomegaly Incision/Wound:CD&I  Disposition:   Discharge Orders    Future Orders Please Complete By Expires   Diet general      Increase activity slowly      Driving Restrictions      Comments:   No driving for 2 weeks   Lifting restrictions      Comments:   No lifting anything greater than 10 pounds (if you have to ask, don't lift it)   Sexual Activity Restrictions      Comments:   Nothing in the vagina for 6 weeks   Call MD for:  temperature >100.4      Call MD for:  persistant nausea and vomiting      Call MD for:  severe uncontrolled pain      Call MD for:  redness, tenderness, or signs of infection (pain, swelling, redness, odor or green/yellow discharge around incision site)      Call MD for:  difficulty breathing, headache or visual disturbances          Medication List     As of 12/14/2011  9:10 AM    TAKE these medications         ibuprofen 600 MG tablet   Commonly known as:  ADVIL,MOTRIN   Take 1 tablet (600 mg total) by mouth every 6 (six) hours as needed (mild pain).      oxyCODONE-acetaminophen 5-325 MG per tablet   Commonly known as: PERCOCET/ROXICET   Take 1-2 tablets by mouth every 4 (four) hours as needed (moderate to severe pain (when tolerating fluids)).         Signed: Maricel Swartzendruber C 12/14/2011, 9:10 AM

## 2011-12-14 NOTE — Progress Notes (Signed)
UR Chart review completed.  

## 2012-12-15 ENCOUNTER — Telehealth: Payer: Self-pay | Admitting: Family Medicine

## 2012-12-15 NOTE — Telephone Encounter (Signed)
No available new appts before January, psl let her know, if still interested may schedule then where available (assuming she has insurance)

## 2012-12-20 NOTE — Telephone Encounter (Signed)
Spoke with [name] she is going to give patient the message

## 2013-03-12 ENCOUNTER — Encounter (INDEPENDENT_AMBULATORY_CARE_PROVIDER_SITE_OTHER): Payer: Self-pay

## 2013-03-12 ENCOUNTER — Encounter: Payer: Self-pay | Admitting: Family Medicine

## 2013-03-12 ENCOUNTER — Ambulatory Visit (INDEPENDENT_AMBULATORY_CARE_PROVIDER_SITE_OTHER): Payer: BC Managed Care – PPO | Admitting: Family Medicine

## 2013-03-12 VITALS — BP 102/70 | HR 70 | Resp 18 | Ht 66.0 in | Wt 207.1 lb

## 2013-03-12 DIAGNOSIS — J302 Other seasonal allergic rhinitis: Secondary | ICD-10-CM

## 2013-03-12 DIAGNOSIS — K3189 Other diseases of stomach and duodenum: Secondary | ICD-10-CM

## 2013-03-12 DIAGNOSIS — F419 Anxiety disorder, unspecified: Secondary | ICD-10-CM

## 2013-03-12 DIAGNOSIS — Z139 Encounter for screening, unspecified: Secondary | ICD-10-CM

## 2013-03-12 DIAGNOSIS — F489 Nonpsychotic mental disorder, unspecified: Secondary | ICD-10-CM

## 2013-03-12 DIAGNOSIS — R1013 Epigastric pain: Secondary | ICD-10-CM

## 2013-03-12 DIAGNOSIS — K219 Gastro-esophageal reflux disease without esophagitis: Secondary | ICD-10-CM | POA: Insufficient documentation

## 2013-03-12 DIAGNOSIS — J3089 Other allergic rhinitis: Secondary | ICD-10-CM

## 2013-03-12 DIAGNOSIS — L738 Other specified follicular disorders: Secondary | ICD-10-CM

## 2013-03-12 DIAGNOSIS — J309 Allergic rhinitis, unspecified: Secondary | ICD-10-CM

## 2013-03-12 DIAGNOSIS — F411 Generalized anxiety disorder: Secondary | ICD-10-CM

## 2013-03-12 DIAGNOSIS — E669 Obesity, unspecified: Secondary | ICD-10-CM

## 2013-03-12 DIAGNOSIS — F5105 Insomnia due to other mental disorder: Secondary | ICD-10-CM

## 2013-03-12 DIAGNOSIS — Z23 Encounter for immunization: Secondary | ICD-10-CM

## 2013-03-12 DIAGNOSIS — L739 Follicular disorder, unspecified: Secondary | ICD-10-CM | POA: Insufficient documentation

## 2013-03-12 DIAGNOSIS — R490 Dysphonia: Secondary | ICD-10-CM | POA: Insufficient documentation

## 2013-03-12 HISTORY — DX: Dysphonia: R49.0

## 2013-03-12 MED ORDER — BUSPIRONE HCL 5 MG PO TABS: 5.0000 mg | ORAL_TABLET | Freq: Two times a day (BID) | ORAL | Status: DC

## 2013-03-12 MED ORDER — TEMAZEPAM 7.5 MG PO CAPS: 7.5000 mg | ORAL_CAPSULE | Freq: Every evening | ORAL | Status: DC | PRN

## 2013-03-12 MED ORDER — RABEPRAZOLE SODIUM 20 MG PO TBEC: 20.0000 mg | DELAYED_RELEASE_TABLET | Freq: Every day | ORAL | Status: DC

## 2013-03-12 MED ORDER — FLUTICASONE PROPIONATE 50 MCG/ACT NA SUSP: 2.0000 | Freq: Every day | NASAL | Status: DC

## 2013-03-12 NOTE — Assessment & Plan Note (Signed)
Uncontrolled, start daily flonase 

## 2013-03-12 NOTE — Assessment & Plan Note (Signed)
Sleep hygiene reviewed and written information offered also. Prescription sent for  medication needed.  

## 2013-03-12 NOTE — Assessment & Plan Note (Signed)
Chronic , interferes with sleep and overall level of function. Start daily exercise, eat on schedule with children and start buspar

## 2013-03-12 NOTE — Assessment & Plan Note (Signed)
Advised to clip hair, not shave. Topical bactroban, which she already has, to be used twice daily, as needed, for acute flare, affects left axilla, healed cyst present

## 2013-03-12 NOTE — Assessment & Plan Note (Addendum)
Severe symptoms, uncontrolled. Pt ed done, already has  low/no caffeine intake and non smoker. Start PPI, check H pylori, and GI eval based on dysphagia type symptom and c/po pain with eating, also chronicity of the problem Also need gallbladder eval based on symptoms

## 2013-03-12 NOTE — Progress Notes (Signed)
Subjective:    Patient ID: [name], female    DOB: September 10, 1980, 33 y.o.   MRN: [id number]  HPI Pt in to re establish care. Since last seen she has delivered 3 healthy children and is currently a full time Mom, who is married. Immunization is updated. She will schedule pelvic which is due  2 month h/o epigastric burning and pain on eating, afraid to eat. Woke up in the night about 1 week ago due to severe epigastric pain, also reports bloating, belching and RUQ pain. During pregnancy she did have reflux.Low caffeine intake.No nicotine Has experienced food sitting in lower esophagus and will regurgitate food eaten up to 2 hours after her meal. Chronic painless hoarseness, has had abnormal exam on indirect laryngoscopy in the past. Chronic head congestion and stuffiness , with post nasal drip, no fever or chills Recurrent painful cyst under left armpit, does shave, at times large and  Longstanding h/o chronic anxiety and poor sleep, prob 4 to 5 hrs per night, has chronic fatigue as a result, negative depression screen   Review of Systems See HPI Denies recent fever or chills. Denies sinus pressure,  ear pain or sore throat. Denies chest congestion, productive cough or wheezing. Denies chest pains, palpitations and leg swelling Denies  vomiting,diarrhea or constipation.   Denies dysuria, frequency, hesitancy or incontinence. Denies joint pain, swelling and limitation in mobility. Denies headaches, seizures, numbness, or tingling.        Objective:   Physical Exam  BP 102/70  Pulse 70  Resp 18  Ht 5\' 6"  (1.676 m)  Wt 207 lb 1.3 oz (93.931 kg)  BMI 33.44 kg/m2  SpO2 99%  LMP 11/21/2011 Patient alert and oriented and in no cardiopulmonary distress.  HEENT: No facial asymmetry, EOMI, no sinus tenderness,  oropharynx pink and moist.  Neck supple no adenopathy.  Chest: Clear to auscultation bilaterally.  CVS: S1, S2 no murmurs, no S3.  ABD: Soft mild epigastric  tenderness, no guarding or rebound.   Ext: No edema  MS: Adequate ROM spine, shoulders, hips and knees.  Skin: Intact, healed cyst in left axilla, where pt had infected follice  Psych: Good eye contact, normal affect. Memory intact mildlyt anxious not depressed appearing.  CNS: CN 2-12 intact, power, tone and sensation normal throughout.       Assessment & Plan:  GERD (gastroesophageal reflux disease) Severe symptoms, uncontrolled. Pt ed done, already has  low/no caffeine intake and non smoker. Start PPI, check H pylori, and GI eval based on dysphagia type symptom and c/po pain with eating, also chronicity of the problem Also need gallbladder eval based on symptoms  Hoarseness, chronic Progressive chronic painless hoarseness, with past abn exam, refer ENT, also start PPI  Insomnia secondary to anxiety Sleep hygiene reviewed and written information offered also. Prescription sent for  medication needed.   OBESITY Deteriorated. Patient re-educated about  the importance of commitment to a  minimum of 150 minutes of exercise per week. The importance of healthy food choices with portion control discussed. Encouraged to start a food diary, count calories and to consider  joining a support group. Sample diet sheets offered. Goals set by the patient for the next several months.     Generalized anxiety disorder Chronic , interferes with sleep and overall level of function. Start daily exercise, eat on schedule with children and start buspar  Folliculitis Advised to clip hair, not shave. Topical bactroban, which she already has, to be used twice daily,  as needed, for acute flare, affects left axilla, healed cyst present  Seasonal and perennial allergic rhinitis Uncontrolled, start daily flonase

## 2013-03-12 NOTE — Assessment & Plan Note (Signed)
Progressive chronic painless hoarseness, with past abn exam, refer ENT, also start PPI

## 2013-03-12 NOTE — Assessment & Plan Note (Signed)
Deteriorated. Patient re-educated about  the importance of commitment to a  minimum of 150 minutes of exercise per week. The importance of healthy food choices with portion control discussed. Encouraged to start a food diary, count calories and to consider  joining a support group. Sample diet sheets offered. Goals set by the patient for the next several months.    

## 2013-03-12 NOTE — Patient Instructions (Addendum)
F/U in 2 months, call if you need me before  It is important that you exercise regularly at least 30 minutes 5 times a week. If you develop chest pain, have severe difficulty breathing, or feel very tired, stop exercising immediately and seek medical attention    A healthy diet is rich in fruit, vegetables and whole grains. Poultry fish, nuts and beans are a healthy choice for protein rather then red meat. A low sodium diet and drinking 64 ounces of water daily is generally recommended. Oils and sweet should be limited. Carbohydrates especially for those who are diabetic or overweight, should be limited to 45 to 60 gram per meal. It is important to eat on a regular schedule, at least 3 times daily. Snacks should be primarily fruits, vegetables or nuts.  Adequate rest, generally 6 to 8 hours per night is important for good health.Good sleep hygiene involves setting a regular bedtime, and turning off all sound and light in your sleep environment.Limiting caffeine intake will also help with the ability to rest well  Lab work needs to be done this week please fasting  All medications need to be brought to every visit  New medications, for reflux, allergies, anxiety and poor sleep, also for recurrent skin infection You are referred to ENT, chronic hoarseness, as well as to GI  For GERD You are referred for [place] of gall bladder

## 2013-03-15 ENCOUNTER — Ambulatory Visit (HOSPITAL_COMMUNITY)
Admission: RE | Admit: 2013-03-15 | Discharge: 2013-03-15 | Disposition: A | Discharge status: Home / Self Care | Payer: BC Managed Care – PPO | Source: Ambulatory Visit | Attending: Family Medicine | Admitting: Family Medicine

## 2013-03-15 ENCOUNTER — Encounter (INDEPENDENT_AMBULATORY_CARE_PROVIDER_SITE_OTHER): Payer: Self-pay | Admitting: *Deleted

## 2013-03-15 DIAGNOSIS — K219 Gastro-esophageal reflux disease without esophagitis: Secondary | ICD-10-CM

## 2013-03-15 DIAGNOSIS — R1013 Epigastric pain: Secondary | ICD-10-CM

## 2013-03-15 DIAGNOSIS — R109 Unspecified abdominal pain: Secondary | ICD-10-CM | POA: Insufficient documentation

## 2013-03-22 ENCOUNTER — Ambulatory Visit (INDEPENDENT_AMBULATORY_CARE_PROVIDER_SITE_OTHER): Payer: BC Managed Care – PPO | Admitting: Internal Medicine

## 2013-03-22 ENCOUNTER — Encounter (INDEPENDENT_AMBULATORY_CARE_PROVIDER_SITE_OTHER): Payer: Self-pay | Admitting: Internal Medicine

## 2013-03-22 VITALS — BP 122/62 | HR 72 | Temp 97.7°F | Ht 66.0 in | Wt 208.7 lb

## 2013-03-22 DIAGNOSIS — R1310 Dysphagia, unspecified: Secondary | ICD-10-CM | POA: Insufficient documentation

## 2013-03-22 NOTE — Progress Notes (Signed)
Subjective:     Patient ID: [name], female   DOB: 04-24-1980, 33 y.o.   MRN: [id number]  HPI Referred to our office for GERD type symptoms by Dr. Moshe Cipro. She tells me a lot of times when she eats, the food will come back up. Sometime it feels like the food sits in her lower esophagus. She will vomit and the food will come back up. Occurs about twice a week.  She says sometimes it feels like the food is slow to go down.  She does belch frequently especially at night.  The Aciphex is helping.  Appetite is good. She has not lost any weight. She does have epigastric pain off and on. She usually has a BM daily. No melena or bright red rectal bleeding.  Last period: 03/15/2013: US abdomen: Rt upper quadrant pain and GERD: IMPRESSION:  Liver is prominent but otherwise normal in sonographic appearance.  Study otherwise unremarkable.     Review of Systems Past Medical History  Diagnosis Date  . No pertinent past medical history     Past Surgical History  Procedure Laterality Date  . Tonsillectomy    . Wisdom tooth extraction    . Laparoscopic assisted vaginal hysterectomy  12/13/2011    Procedure: LAPAROSCOPIC ASSISTED VAGINAL HYSTERECTOMY;  Surgeon: Luz Lex, MD;  Location: [facility];  Service: Gynecology;  Laterality: N/A;  . Abdominal hysterectomy  2013  . Cesarean section  2012    twins    No Known Allergies  Current Outpatient Prescriptions on File Prior to Visit  Medication Sig Dispense Refill  . busPIRone (BUSPAR) 5 MG tablet Take 1 tablet (5 mg total) by mouth 2 (two) times daily.  60 tablet  3  . fluticasone (FLONASE) 50 MCG/ACT nasal spray Place 2 sprays into both nostrils daily.  16 g  6  . mupirocin ointment (BACTROBAN) 2 % Apply 1 application topically 2 (two) times daily.  22 g  0  . RABEprazole (ACIPHEX) 20 MG tablet Take 1 tablet (20 mg total) by mouth daily.  30 tablet  2  . temazepam (RESTORIL) 7.5 MG capsule Take 1 capsule (7.5 mg total) by mouth at bedtime  as needed for sleep.  30 capsule  2   No current facility-administered medications on file prior to visit.    Married.3 children. Does not work.     Objective:   Physical Exam  Filed Vitals:   03/22/13 1109  BP: 122/62  Pulse: 72  Temp: 97.7 F (36.5 C)  Height: 5\' 6"  (1.676 m)  Weight: 208 lb 11.2 oz (94.666 kg)   Alert and oriented. Skin warm and dry. Oral mucosa is moist.   . Sclera anicteric, conjunctivae is pink. Thyroid not enlarged. No cervical lymphadenopathy. Lungs clear. Heart regular rate and rhythm.  Abdomen is soft. Bowel sounds are positive. No hepatomegaly. No abdominal masses felt. No tenderness.  No edema to lower extremities.       Assessment:    Possible dysphagia. Foods are slow to go down.  ? Etiology.    Plan:    Esophagram. Further recommendations to follow

## 2013-03-22 NOTE — Patient Instructions (Signed)
DG esphagus. Further recommendations to follow. Get lab drawn.

## 2013-03-27 ENCOUNTER — Ambulatory Visit (HOSPITAL_COMMUNITY)
Admission: RE | Admit: 2013-03-27 | Discharge: 2013-03-27 | Disposition: A | Discharge status: Home / Self Care | Payer: BC Managed Care – PPO | Source: Ambulatory Visit | Attending: Internal Medicine | Admitting: Internal Medicine

## 2013-03-27 DIAGNOSIS — R1013 Epigastric pain: Secondary | ICD-10-CM | POA: Insufficient documentation

## 2013-03-27 DIAGNOSIS — R112 Nausea with vomiting, unspecified: Secondary | ICD-10-CM | POA: Insufficient documentation

## 2013-03-27 DIAGNOSIS — R1310 Dysphagia, unspecified: Secondary | ICD-10-CM | POA: Insufficient documentation

## 2013-03-27 DIAGNOSIS — K449 Diaphragmatic hernia without obstruction or gangrene: Secondary | ICD-10-CM | POA: Insufficient documentation

## 2013-03-31 LAB — CBC
HCT: 40.1 % (ref 36.0–46.0)
Hemoglobin: 13.4 g/dL (ref 12.0–15.0)
MCH: 29.5 pg (ref 26.0–34.0)
MCHC: 33.4 g/dL (ref 30.0–36.0)
MCV: 88.3 fL (ref 78.0–100.0)
Platelets: 249 10*3/uL (ref 150–400)
RBC: 4.54 MIL/uL (ref 3.87–5.11)
RDW: 13.4 % (ref 11.5–15.5)
WBC: 4.3 10*3/uL (ref 4.0–10.5)

## 2013-03-31 LAB — LIPID PANEL
Cholesterol: 138 mg/dL (ref 0–200)
HDL: 45 mg/dL (ref >39)
LDL Cholesterol: 80 mg/dL (ref 0–99)
Total CHOL/HDL Ratio: 3.1 Ratio
Triglycerides: 63 mg/dL (ref <150)
VLDL: 13 mg/dL (ref 0–40)

## 2013-03-31 LAB — BASIC METABOLIC PANEL
BUN: 9 mg/dL (ref 6–23)
CO2: 25 mEq/L (ref 19–32)
Calcium: 9.2 mg/dL (ref 8.4–10.5)
Chloride: 105 mEq/L (ref 96–112)
Creat: 0.74 mg/dL (ref 0.50–1.10)
Glucose, Bld: 86 mg/dL (ref 70–99)
Potassium: 4.3 mEq/L (ref 3.5–5.3)
Sodium: 138 mEq/L (ref 135–145)

## 2013-03-31 LAB — TSH: TSH: 0.822 u[IU]/mL (ref 0.350–4.500)

## 2013-03-31 LAB — HEMOGLOBIN A1C
Hgb A1c MFr Bld: 5.4 % (ref <5.7)
Mean Plasma Glucose: 108 mg/dL (ref <117)

## 2013-04-02 ENCOUNTER — Encounter: Payer: Self-pay | Admitting: Family Medicine

## 2013-04-02 LAB — H. PYLORI ANTIBODY, IGG: H Pylori IgG: 0.43 {ISR}

## 2013-04-05 ENCOUNTER — Ambulatory Visit (INDEPENDENT_AMBULATORY_CARE_PROVIDER_SITE_OTHER): Payer: BC Managed Care – PPO | Admitting: Otolaryngology

## 2013-04-05 DIAGNOSIS — K219 Gastro-esophageal reflux disease without esophagitis: Secondary | ICD-10-CM

## 2013-04-05 DIAGNOSIS — R490 Dysphonia: Secondary | ICD-10-CM

## 2013-04-10 ENCOUNTER — Encounter (INDEPENDENT_AMBULATORY_CARE_PROVIDER_SITE_OTHER): Payer: Self-pay | Admitting: *Deleted

## 2013-04-10 ENCOUNTER — Other Ambulatory Visit (INDEPENDENT_AMBULATORY_CARE_PROVIDER_SITE_OTHER): Payer: Self-pay | Admitting: *Deleted

## 2013-04-10 DIAGNOSIS — K219 Gastro-esophageal reflux disease without esophagitis: Secondary | ICD-10-CM

## 2013-04-10 NOTE — Progress Notes (Signed)
Left message for patient to call to sch'd EGD

## 2013-04-19 ENCOUNTER — Encounter (HOSPITAL_COMMUNITY): Payer: Self-pay

## 2013-05-02 ENCOUNTER — Encounter (HOSPITAL_COMMUNITY): Payer: Self-pay

## 2013-05-02 ENCOUNTER — Encounter (HOSPITAL_COMMUNITY)
Admission: RE | Disposition: A | Discharge status: Home / Self Care | Payer: Self-pay | Source: Ambulatory Visit | Attending: Internal Medicine

## 2013-05-02 ENCOUNTER — Ambulatory Visit (HOSPITAL_COMMUNITY)
Admission: RE | Admit: 2013-05-02 | Discharge: 2013-05-02 | Disposition: A | Discharge status: Home / Self Care | Payer: BC Managed Care – PPO | Source: Ambulatory Visit | Attending: Internal Medicine | Admitting: Internal Medicine

## 2013-05-02 DIAGNOSIS — K219 Gastro-esophageal reflux disease without esophagitis: Secondary | ICD-10-CM

## 2013-05-02 DIAGNOSIS — R112 Nausea with vomiting, unspecified: Secondary | ICD-10-CM | POA: Insufficient documentation

## 2013-05-02 DIAGNOSIS — F411 Generalized anxiety disorder: Secondary | ICD-10-CM | POA: Insufficient documentation

## 2013-05-02 DIAGNOSIS — K449 Diaphragmatic hernia without obstruction or gangrene: Secondary | ICD-10-CM | POA: Insufficient documentation

## 2013-05-02 DIAGNOSIS — R1013 Epigastric pain: Secondary | ICD-10-CM | POA: Insufficient documentation

## 2013-05-02 DIAGNOSIS — K2940 Chronic atrophic gastritis without bleeding: Secondary | ICD-10-CM | POA: Insufficient documentation

## 2013-05-02 HISTORY — PX: ESOPHAGOGASTRODUODENOSCOPY: SHX5428

## 2013-05-02 HISTORY — DX: Anxiety disorder, unspecified: F41.9

## 2013-05-02 SURGERY — EGD (ESOPHAGOGASTRODUODENOSCOPY)
Anesthesia: Moderate Sedation

## 2013-05-02 MED ORDER — STERILE WATER FOR IRRIGATION IR SOLN
Status: DC | PRN
  Administered 2013-05-02: 15:00:00

## 2013-05-02 MED ORDER — SODIUM CHLORIDE 0.9 % IV SOLN
INTRAVENOUS | Status: DC
  Administered 2013-05-02: 14:00:00 via INTRAVENOUS

## 2013-05-02 MED ORDER — MIDAZOLAM HCL 5 MG/5ML IJ SOLN
INTRAMUSCULAR | Status: AC
  Filled 2013-05-02: qty 10

## 2013-05-02 MED ORDER — BUTAMBEN-TETRACAINE-BENZOCAINE 2-2-14 % EX AERO
INHALATION_SPRAY | CUTANEOUS | Status: DC | PRN
  Administered 2013-05-02: 2 via TOPICAL

## 2013-05-02 MED ORDER — MEPERIDINE HCL 50 MG/ML IJ SOLN
INTRAMUSCULAR | Status: AC
  Filled 2013-05-02: qty 1

## 2013-05-02 MED ORDER — MIDAZOLAM HCL 5 MG/5ML IJ SOLN
INTRAMUSCULAR | Status: DC | PRN
  Administered 2013-05-02: 2 mg via INTRAVENOUS
  Administered 2013-05-02: 1 mg via INTRAVENOUS
  Administered 2013-05-02: 2 mg via INTRAVENOUS
  Administered 2013-05-02: 3 mg via INTRAVENOUS
  Administered 2013-05-02: 2 mg via INTRAVENOUS

## 2013-05-02 MED ORDER — MEPERIDINE HCL 50 MG/ML IJ SOLN
INTRAMUSCULAR | Status: DC | PRN
  Administered 2013-05-02 (×2): 25 mg via INTRAVENOUS

## 2013-05-02 NOTE — Discharge Instructions (Signed)
Resume usual medications and diet. No driving for 24 hours. Physician will call with biopsy results.   Esophagogastroduodenoscopy Care After Refer to this sheet in the next few weeks. These instructions provide you with information on caring for yourself after your procedure. Your caregiver may also give you more specific instructions. Your treatment has been planned according to current medical practices, but problems sometimes occur. Call your caregiver if you have any problems or questions after your procedure.  HOME CARE INSTRUCTIONS  Do not eat or drink anything until the numbing medicine (local anesthetic) has worn off and your gag reflex has returned. You will know that the local anesthetic has worn off when you can swallow comfortably.  Do not drive for 12 hours after the procedure or as directed by your caregiver.  Only take medicines as directed by your caregiver. SEEK MEDICAL CARE IF:   You cannot stop coughing.  You are not urinating at all or less than usual. SEEK IMMEDIATE MEDICAL CARE IF:  You have difficulty swallowing.  You cannot eat or drink.  You have worsening throat or chest pain.  You have dizziness, lightheadedness, or you faint.  You have nausea or vomiting.  You have chills.  You have a fever.  You have severe abdominal pain.  You have black, tarry, or bloody stools. Document Released: 01/05/2012 Document Reviewed: 01/05/2012 [facility] Patient Information 2014 [place], [place].

## 2013-05-02 NOTE — Op Note (Signed)
EGD PROCEDURE REPORT  PATIENT:  [name]  MR#:  [id number] Birthdate:  04/05/1980, 34 y.o., female Endoscopist:  Dr. Rogene Houston, MD Referred By:  Dr. Tula Nakayama, MD  Procedure Date: 05/02/2013  Procedure:   EGD  Indications:  Patient is 33 year old African female who presents for a 5 month history of heartburn regurgitation nausea vomiting and epigastric pain. Ultrasound is negative for cholelithiasis. She is 50% better with double dose PPI. She is undergoing diagnostic EGD.            Informed Consent:  The risks, benefits, alternatives & imponderables which include, but are not limited to, bleeding, infection, perforation, drug reaction and potential missed lesion have been reviewed.  The potential for biopsy, lesion removal, esophageal dilation, etc. have also been discussed.  Questions have been answered.  All parties agreeable.  Please see history & physical in medical record for more information.  Medications:  Demerol 50 mg IV Versed 10 mg IV Cetacaine spray topically for oropharyngeal anesthesia  Description of procedure:  The endoscope was introduced through the mouth and advanced to the second portion of the duodenum without difficulty or limitations. The mucosal surfaces were surveyed very carefully during advancement of the scope and upon withdrawal.  Findings:  Esophagus:  Mucosa of the esophagus was normal. GE junction was serrated with focal erythema and asked her to mucosa. Hiatus was wide open. GEJ:  35 cm Hiatus:  37 cm Stomach:  Stomach was empty and distended very well with insufflation. Folds in the proximal stomach were normal. Examination of mucosa at gastric body was normal. Patchy erythema noted to antral mucosa along with erosions and edematous and erythematous prepyloric fold partially covering the pylorus but was patent. Angularis, fundus and cardia was examined by retroflex of the scope and were normal. Duodenum:  Normal bulbar and post bulbar  mucosa.  Therapeutic/Diagnostic Maneuvers Performed:   Biopsy taken from GE junction for routine histology. Biopsy also taken from antral/prepyloric gastric mucosa.  Complications:  None  Impression: Small sliding hiatal hernia with serrated GE junction. Biopsies taken to rule out short segment Barrett's. Erosive antral gastritis. Biopsy taken for routine histology.  Recommendations:  Continue anti-reflux measures and AcipHex or rabeprazole 20 mg by mouth twice a day. I will be contacting patient with results of biopsy and further recommendations.  REHMAN,NAJEEB U  05/02/2013  3:20 PM  CC: Dr. Tula Nakayama, MD & Dr. Rayne Du ref. provider found

## 2013-05-02 NOTE — H&P (Signed)
[name] is an 33 y.o. female.   Chief Complaint: Patient is  here for EGD. HPI: Patient is 33 year old female who presents with 4-5 month history of heartburn regurgitation nausea and vomiting. She also has experienced postprandial epigastric pain. Last episode was 2 weeks ago. She was initially treated with pantoprazole but did not see any benefit after having been on it for 3 weeks. She was seen by ENT specialist in order to help he had been switched to AcipHex twice a day and she feels 50% better. She had negative upper abdominal ultrasound. There is no history of hematemesis or melena .  Past Medical History  Diagnosis Date  . No pertinent past medical history   . Anxiety     Past Surgical History  Procedure Laterality Date  . Tonsillectomy    . Wisdom tooth extraction    . Laparoscopic assisted vaginal hysterectomy  12/13/2011    Procedure: LAPAROSCOPIC ASSISTED VAGINAL HYSTERECTOMY;  Surgeon: Luz Lex, MD;  Location: [facility];  Service: Gynecology;  Laterality: N/A;  . Abdominal hysterectomy  2013  . Cesarean section  2012    twins    Family History  Problem Relation Age of Onset  . Hypertension Mother   . Hyperlipidemia Mother   . Allergies Mother   . Hypertension Father    Social History:  reports that she has never smoked. She has never used smokeless tobacco. She reports that she does not drink alcohol or use illicit drugs.  Allergies: No Known Allergies  Medications Prior to Admission  Medication Sig Dispense Refill  . busPIRone (BUSPAR) 5 MG tablet Take 1 tablet (5 mg total) by mouth 2 (two) times daily.  60 tablet  3  . fluticasone (FLONASE) 50 MCG/ACT nasal spray Place 2 sprays into both nostrils daily.  16 g  6  . mupirocin ointment (BACTROBAN) 2 % Apply 1 application topically 2 (two) times daily.  22 g  0  . Aciphex 20 MG  Take 20 mg by mouth 2 (two) times daily.      . temazepam (RESTORIL) 7.5 MG capsule Take 1 capsule (7.5 mg total) by mouth at  bedtime as needed for sleep.  30 capsule  2    No results found for this or any previous visit (from the past 48 hour(s)). No results found.  ROS  Blood pressure 127/82, pulse 66, temperature 98.2 F (36.8 C), temperature source Oral, resp. rate 19, height 5\' 6"  (1.676 m), weight 208 lb (94.348 kg), last menstrual period 11/21/2011, SpO2 99.00%. Physical Exam  Constitutional: She appears well-developed and well-nourished.  HENT:  Mouth/Throat: Oropharynx is clear and moist.  Eyes: Conjunctivae are normal. No scleral icterus.  Neck: No thyromegaly present.  Cardiovascular: Normal rate, regular rhythm and normal heart sounds.   No murmur heard. Respiratory: Effort normal and breath sounds normal.  GI: Soft. She exhibits no distension and no mass. There is no tenderness.  Musculoskeletal: She exhibits no edema.  Lymphadenopathy:    She has no cervical adenopathy.  Neurological: She is alert.  Skin: Skin is warm and dry.     Assessment/Plan Refractory GERD. Nausea vomiting and epigastric pain. Diagnostic EGD.  REHMAN,NAJEEB U 05/02/2013, 2:47 PM

## 2013-05-08 ENCOUNTER — Encounter (HOSPITAL_COMMUNITY): Payer: Self-pay | Admitting: Internal Medicine

## 2013-05-10 ENCOUNTER — Ambulatory Visit: Payer: BC Managed Care – PPO | Admitting: Family Medicine

## 2013-05-10 ENCOUNTER — Encounter (INDEPENDENT_AMBULATORY_CARE_PROVIDER_SITE_OTHER): Payer: Self-pay | Admitting: *Deleted

## 2013-05-15 ENCOUNTER — Encounter (INDEPENDENT_AMBULATORY_CARE_PROVIDER_SITE_OTHER): Payer: Self-pay | Admitting: *Deleted

## 2013-05-15 ENCOUNTER — Other Ambulatory Visit (INDEPENDENT_AMBULATORY_CARE_PROVIDER_SITE_OTHER): Payer: Self-pay | Admitting: *Deleted

## 2013-05-15 DIAGNOSIS — R1013 Epigastric pain: Secondary | ICD-10-CM

## 2013-05-15 DIAGNOSIS — R112 Nausea with vomiting, unspecified: Secondary | ICD-10-CM

## 2013-05-16 ENCOUNTER — Encounter (HOSPITAL_COMMUNITY): Payer: Self-pay | Admitting: Pharmacy Technician

## 2013-05-17 ENCOUNTER — Ambulatory Visit (INDEPENDENT_AMBULATORY_CARE_PROVIDER_SITE_OTHER): Payer: BC Managed Care – PPO | Admitting: Otolaryngology

## 2013-05-17 DIAGNOSIS — R490 Dysphonia: Secondary | ICD-10-CM

## 2013-05-17 DIAGNOSIS — K219 Gastro-esophageal reflux disease without esophagitis: Secondary | ICD-10-CM

## 2013-05-28 ENCOUNTER — Encounter (HOSPITAL_COMMUNITY)
Admission: RE | Disposition: A | Discharge status: Home / Self Care | Payer: Self-pay | Source: Ambulatory Visit | Attending: Internal Medicine

## 2013-05-28 ENCOUNTER — Ambulatory Visit (HOSPITAL_COMMUNITY)
Admission: RE | Admit: 2013-05-28 | Discharge: 2013-05-28 | Disposition: A | Discharge status: Home / Self Care | Payer: BC Managed Care – PPO | Source: Ambulatory Visit | Attending: Internal Medicine | Admitting: Internal Medicine

## 2013-05-28 DIAGNOSIS — K219 Gastro-esophageal reflux disease without esophagitis: Secondary | ICD-10-CM | POA: Insufficient documentation

## 2013-05-28 DIAGNOSIS — R112 Nausea with vomiting, unspecified: Secondary | ICD-10-CM | POA: Insufficient documentation

## 2013-05-28 SURGERY — CAPSULE ENDOSCOPY, USING SMARTPILL MOTILITY TESTING SYSTEM

## 2013-05-28 NOTE — H&P (Signed)
[name] is an 33 y.o. female.   Chief Complaint: Patient is here for Smart pill study. HPI: Patient is a 33 year old African American female who presents with 6 month history of heartburn and regurgitation nausea vomiting and epigastric pain. Ultrasound was negative for cholelithiasis. She has not responded to double dose PPI therapy. She underwent EGD about 4 weeks ago revealing serrated GE junction. She also had erosive gastritis. Biopsy from GE junction revealed changes of reflux esophagitis and antral biopsy revealed inactive gastritis and H. pylori stains were negative. Since she is not responding to anti-reflux therapy concerned she may have underlying gastroparesis he is and therefore undergoing this study.  Past Medical History  Diagnosis Date  . No pertinent past medical history   . Anxiety     Past Surgical History  Procedure Laterality Date  . Tonsillectomy    . Wisdom tooth extraction    . Laparoscopic assisted vaginal hysterectomy  12/13/2011    Procedure: LAPAROSCOPIC ASSISTED VAGINAL HYSTERECTOMY;  Surgeon: Luz Lex, MD;  Location: [facility];  Service: Gynecology;  Laterality: N/A;  . Abdominal hysterectomy  2013  . Cesarean section  2012    twins  . Esophagogastroduodenoscopy N/A 05/02/2013    Procedure: ESOPHAGOGASTRODUODENOSCOPY (EGD);  Surgeon: Rogene Houston, MD;  [facility];  Service: Endoscopy;  Laterality: N/A;  41    Family History  Problem Relation Age of Onset  . Hypertension Mother   . Hyperlipidemia Mother   . Allergies Mother   . Hypertension Father    Social History:  reports that she has never smoked. She has never used smokeless tobacco. She reports that she does not drink alcohol or use illicit drugs.  Allergies: No Known Allergies  Medications Prior to Admission  Medication Sig Dispense Refill  . busPIRone (BUSPAR) 5 MG tablet Take 1 tablet (5 mg total) by mouth 2 (two) times daily.  60 tablet  3  . fluticasone (FLONASE) 50  MCG/ACT nasal spray Place 2 sprays into both nostrils daily.  16 g  6  . mupirocin ointment (BACTROBAN) 2 % Apply 1 application topically daily as needed (rash).   22 g  0  . omeprazole (PRILOSEC) 20 MG capsule Take 20 mg by mouth 2 (two) times daily before a meal.      . temazepam (RESTORIL) 7.5 MG capsule Take 1 capsule (7.5 mg total) by mouth at bedtime as needed for sleep.  30 capsule  2    No results found for this or any previous visit (from the past 48 hour(s)). No results found.  ROS  Height 5\' 6"  (1.676 m), weight 196 lb (88.905 kg), last menstrual period 11/21/2011. Physical Exam   Assessment/Plan Poorly controlled symptoms of GERD. Recurrent nausea and vomiting. ? Gastroparesis. Smart pill study.  Rogene Houston 05/28/2013, 9:49 PM

## 2013-06-10 NOTE — Op Note (Signed)
Procedure; GI motility study with smart pill.   Date of procedure; 05/28/2013.  Indication; patient 33 year old African American female who has refractory GERD symptoms. She is undergoing this study to rule out gastroparesis. Findings;  Gastric emptying time(GET) 2 hour and 1 minutes(normal 4:10)  Small bowel transit time; 7 hours and 13 minutes   Colonic transit time; 76 hours and 5 mintes   Small and large bowel transit time; 83 hours and 18 minutes  Whole gut transit time; 85 hours and 20 minutes (normal less than 75 hours).  Assessment;  Gastric emptying time is well within normal limits.  Whole gut transit time is slightly prolonged and of questionable significance.  Recommendations;  Will arrange for office visit to discuss further options which may include referral for anti-reflux surgery.  She will need pH study and esophageal manometry if she chooses to proceed with surgery.

## 2013-06-11 ENCOUNTER — Ambulatory Visit (INDEPENDENT_AMBULATORY_CARE_PROVIDER_SITE_OTHER): Payer: BC Managed Care – PPO | Admitting: Family Medicine

## 2013-06-11 ENCOUNTER — Encounter: Payer: Self-pay | Admitting: Family Medicine

## 2013-06-11 VITALS — BP 122/78 | HR 86 | Resp 18 | Ht 66.0 in | Wt 202.0 lb

## 2013-06-11 DIAGNOSIS — E669 Obesity, unspecified: Secondary | ICD-10-CM

## 2013-06-11 DIAGNOSIS — J309 Allergic rhinitis, unspecified: Secondary | ICD-10-CM

## 2013-06-11 DIAGNOSIS — F489 Nonpsychotic mental disorder, unspecified: Secondary | ICD-10-CM

## 2013-06-11 DIAGNOSIS — F411 Generalized anxiety disorder: Secondary | ICD-10-CM

## 2013-06-11 DIAGNOSIS — F419 Anxiety disorder, unspecified: Secondary | ICD-10-CM

## 2013-06-11 DIAGNOSIS — J302 Other seasonal allergic rhinitis: Secondary | ICD-10-CM

## 2013-06-11 DIAGNOSIS — J3089 Other allergic rhinitis: Secondary | ICD-10-CM

## 2013-06-11 DIAGNOSIS — K219 Gastro-esophageal reflux disease without esophagitis: Secondary | ICD-10-CM

## 2013-06-11 DIAGNOSIS — F5105 Insomnia due to other mental disorder: Secondary | ICD-10-CM

## 2013-06-11 MED ORDER — TEMAZEPAM 15 MG PO CAPS: 15.0000 mg | ORAL_CAPSULE | Freq: Every evening | ORAL | Status: DC | PRN

## 2013-06-11 MED ORDER — BUSPIRONE HCL 7.5 MG PO TABS: 7.5000 mg | ORAL_TABLET | Freq: Three times a day (TID) | ORAL | Status: DC

## 2013-06-11 MED ORDER — LORATADINE 10 MG PO TABS: 10.0000 mg | ORAL_TABLET | Freq: Every day | ORAL | Status: DC

## 2013-06-11 NOTE — Patient Instructions (Addendum)
F/u in 3.5 month, call if you need me before  I am thankful you are doing better, continue medication for reflux per GI and ENT  PLEASE focus on improved allergy control, commit to daily flonase and loratidine , and also flushing nostrils with saline regularly results in less mucus and improved breathing and speech  Increase in dose of buspar and restoril for anxiety and sleep effective today, call in if not effective  Congrats on healthy weight loss, keep it up, goal is approx 2.5 to 3 pounds per month

## 2013-07-19 ENCOUNTER — Ambulatory Visit (INDEPENDENT_AMBULATORY_CARE_PROVIDER_SITE_OTHER): Payer: BC Managed Care – PPO | Admitting: Otolaryngology

## 2013-07-19 DIAGNOSIS — R490 Dysphonia: Secondary | ICD-10-CM

## 2013-07-19 DIAGNOSIS — K219 Gastro-esophageal reflux disease without esophagitis: Secondary | ICD-10-CM

## 2013-09-10 NOTE — Assessment & Plan Note (Signed)
Sleep hygiene reviewed and written information offered also. Prescription sent for  medication needed Increase in dose of restoril.

## 2013-09-10 NOTE — Assessment & Plan Note (Signed)
Improved on higher dose of PPI, and being followed by GI

## 2013-09-10 NOTE — Assessment & Plan Note (Addendum)
Uncontrolled, needs to commit to dail use of medication Saline nasal flushes encouraged for symptom control also. If this fails will need allergist eval, was on immunotherpy incholdhood

## 2013-09-10 NOTE — Assessment & Plan Note (Signed)
Improved. Pt applauded on succesful weight loss through lifestyle change, and encouraged to continue same. Weight loss goal set for the next several months.  

## 2013-09-10 NOTE — Assessment & Plan Note (Signed)
Improved, but still not adequately controlled , inc in medication dose

## 2013-09-10 NOTE — Progress Notes (Signed)
   Subjective:    Patient ID: [name], female    DOB: [birth year], 33 y.o.   MRN: [id number]  HPI The PT is here for follow up and re-evaluation of chronic medical conditions, medication management and review of any available recent lab and radiology data.  Preventive health is updated, specifically  Cancer screening and Immunization.   Questions or concerns regarding consultations or procedures which the PT has had in the interim are  addressed. The PT denies any adverse reactions to current medications since the last visit.  There are no new concerns. Generally improved since last  visit There are no specific complaints       Review of Systems See HPI Denies recent fever or chills. Denies sinus pressure, nasal congestion, ear pain or sore throat. Denies chest congestion, productive cough or wheezing. Denies chest pains, palpitations and leg swelling Denies abdominal pain, nausea, vomiting,diarrhea or constipation.   Denies dysuria, frequency, hesitancy or incontinence. Denies joint pain, swelling and limitation in mobility. Denies headaches, seizures, numbness, or tingling. Denies skin break down or rash.        Objective:   Physical Exam  BP 122/78  Pulse 86  Resp 18  Ht 5\' 6"  (1.676 m)  Wt 202 lb (91.627 kg)  BMI 32.62 kg/m2  SpO2 99%  LMP 11/21/2011 Patient alert and oriented and in no cardiopulmonary distress.  HEENT: No facial asymmetry, EOMI,   oropharynx pink and moist.  Neck supple no JVD, no mass.  Chest: Clear to auscultation bilaterally.  CVS: S1, S2 no murmurs, no S3.Regular rate.  ABD: Soft non tender.   Ext: No edema  MS: Adequate ROM spine, shoulders, hips and knees.  Skin: Intact, no ulcerations or rash noted.  Psych: Good eye contact, normal affect. Memory intact mildly anxious not depressed appearing.  CNS: CN 2-12 intact, power,  normal throughout.no focal deficits noted.       Assessment & Plan:  Generalized anxiety  disorder Improved, but still not adequately controlled , inc in medication dose  Insomnia secondary to anxiety Sleep hygiene reviewed and written information offered also. Prescription sent for  medication needed Increase in dose of restoril.   OBESITY Improved. Pt applauded on succesful weight loss through lifestyle change, and encouraged to continue same. Weight loss goal set for the next several months.   GERD (gastroesophageal reflux disease) Improved on higher dose of PPI, and being followed by GI  Seasonal and perennial allergic rhinitis Uncontrolled, needs to commit to dail use of medication Saline nasal flushes encouraged for symptom control also. If this fails will need allergist eval, was on immunotherpy incholdhood

## 2013-10-18 ENCOUNTER — Ambulatory Visit (INDEPENDENT_AMBULATORY_CARE_PROVIDER_SITE_OTHER): Payer: BC Managed Care – PPO | Admitting: Otolaryngology

## 2013-10-18 DIAGNOSIS — K219 Gastro-esophageal reflux disease without esophagitis: Secondary | ICD-10-CM

## 2013-10-18 DIAGNOSIS — R490 Dysphonia: Secondary | ICD-10-CM

## 2014-04-11 ENCOUNTER — Ambulatory Visit (INDEPENDENT_AMBULATORY_CARE_PROVIDER_SITE_OTHER): Payer: BLUE CROSS/BLUE SHIELD | Admitting: Otolaryngology

## 2014-04-11 DIAGNOSIS — K219 Gastro-esophageal reflux disease without esophagitis: Secondary | ICD-10-CM | POA: Diagnosis not present

## 2014-04-11 DIAGNOSIS — R490 Dysphonia: Secondary | ICD-10-CM | POA: Diagnosis not present

## 2014-10-10 ENCOUNTER — Ambulatory Visit (INDEPENDENT_AMBULATORY_CARE_PROVIDER_SITE_OTHER): Payer: BLUE CROSS/BLUE SHIELD | Admitting: Otolaryngology

## 2014-10-10 DIAGNOSIS — R490 Dysphonia: Secondary | ICD-10-CM

## 2014-10-10 DIAGNOSIS — K219 Gastro-esophageal reflux disease without esophagitis: Secondary | ICD-10-CM | POA: Diagnosis not present

## 2014-11-14 ENCOUNTER — Ambulatory Visit (INDEPENDENT_AMBULATORY_CARE_PROVIDER_SITE_OTHER): Payer: BLUE CROSS/BLUE SHIELD | Admitting: Otolaryngology

## 2014-11-14 DIAGNOSIS — R490 Dysphonia: Secondary | ICD-10-CM

## 2014-11-14 DIAGNOSIS — K219 Gastro-esophageal reflux disease without esophagitis: Secondary | ICD-10-CM

## 2015-01-14 ENCOUNTER — Ambulatory Visit (HOSPITAL_COMMUNITY): Payer: BLUE CROSS/BLUE SHIELD | Admitting: Physical Therapy

## 2015-01-14 ENCOUNTER — Ambulatory Visit (HOSPITAL_COMMUNITY): Payer: BLUE CROSS/BLUE SHIELD | Attending: Orthopedic Surgery | Admitting: Physical Therapy

## 2015-01-14 DIAGNOSIS — Z658 Other specified problems related to psychosocial circumstances: Secondary | ICD-10-CM | POA: Diagnosis present

## 2015-01-14 DIAGNOSIS — M25561 Pain in right knee: Secondary | ICD-10-CM | POA: Insufficient documentation

## 2015-01-14 DIAGNOSIS — M6281 Muscle weakness (generalized): Secondary | ICD-10-CM | POA: Diagnosis present

## 2015-01-14 DIAGNOSIS — Z789 Other specified health status: Secondary | ICD-10-CM

## 2015-01-14 DIAGNOSIS — R262 Difficulty in walking, not elsewhere classified: Secondary | ICD-10-CM | POA: Diagnosis present

## 2015-01-14 DIAGNOSIS — M25662 Stiffness of left knee, not elsewhere classified: Secondary | ICD-10-CM

## 2015-01-14 DIAGNOSIS — M25562 Pain in left knee: Secondary | ICD-10-CM | POA: Diagnosis present

## 2015-01-14 DIAGNOSIS — R6889 Other general symptoms and signs: Secondary | ICD-10-CM

## 2015-01-14 NOTE — Therapy (Signed)
[facility] [place], [place], [address] Phone: [phone]   Fax:  [phone]  Physical Therapy Evaluation  Patient Details  Name: MERYEM NEELEY MRN: [id number] Date of Birth: February 08, 1980 Referring Provider: Dr. Marchia Bond   Encounter Date: 01/14/2015      PT End of Session - 01/14/15 1112    Visit Number 1   Number of Visits 12   Date for PT Re-Evaluation 02/11/15   Authorization Type [employer]    Authorization Time Period 01/14/15 to 03/17/15   PT Start Time 0932   PT Stop Time 1012   PT Time Calculation (min) 40 min   Activity Tolerance Patient tolerated treatment well   Behavior During Therapy [facility] for tasks assessed/performed      Past Medical History  Diagnosis Date  . No pertinent past medical history   . Anxiety     Past Surgical History  Procedure Laterality Date  . Tonsillectomy    . Wisdom tooth extraction    . Laparoscopic assisted vaginal hysterectomy  12/13/2011    Procedure: LAPAROSCOPIC ASSISTED VAGINAL HYSTERECTOMY;  Surgeon: Luz Lex, MD;  Location: [facility];  Service: Gynecology;  Laterality: N/A;  . Abdominal hysterectomy  2013  . Cesarean section  2012    twins  . Esophagogastroduodenoscopy N/A 05/02/2013    Procedure: ESOPHAGOGASTRODUODENOSCOPY (EGD);  Surgeon: Rogene Houston, MD;  [facility];  Service: Endoscopy;  Laterality: N/A;  230    There were no vitals filed for this visit.  Visit Diagnosis:  Knee pain, bilateral - Plan: PT plan of care cert/re-cert  Muscle weakness - Plan: PT plan of care cert/re-cert  Difficulty walking - Plan: PT plan of care cert/re-cert  Difficulty navigating stairs - Plan: PT plan of care cert/re-cert  Knee stiffness, left - Plan: PT plan of care cert/re-cert      Subjective Assessment - 01/14/15 0935    Subjective Stairs and inclines are difficult due to pain, but she does have good and bad days. Has noticed that things shift with  weather changes. Has a hard time staying on floor to play with kids, hard to get up off of floor. Hurts to squat.    Pertinent History Patient reports that she has always had trouble with her knees but over the past year things have just gotten a lot worse. L has always been worse, has been swelling and displayed painful pops. R knee really started bothering her about 6 months ago with same general symptoms.    How long can you stand comfortably? on a bad day, immediate discomfort    How long can you walk comfortably? has to stop and sit intermittently due to pain; 1/2 mile to  a mile has been the furthest she has been able to go    Patient Stated Goals get rid of pain, be able to be more active with kids ( 20 year old and a set of 9 year old twins)   Currently in Pain? Yes   Pain Score 4    Pain Location Knee   Pain Orientation Left;Right            [facility] PT Assessment - 01/14/15 0001    Assessment   Medical Diagnosis B patella femoral syndrome    Referring Provider Dr. Marchia Bond    Onset Date/Surgical Date --  chronic    Next MD Visit follow-up after therapy is done    Precautions   Precautions  None   Restrictions   Weight Bearing Restrictions No   Balance Screen   Has the patient fallen in the past 6 months No   Has the patient had a decrease in activity level because of a fear of falling?  Yes   Is the patient reluctant to leave their home because of a fear of falling?  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    Leisure read, dancing   Observation/Other Assessments   Focus on Therapeutic Outcomes (FOTO)  62% limited    Posture/Postural Control   Posture Comments increased cervical lordosis and lumbar lordosis    AROM   Right Hip External Rotation  --  wfl    Right Hip Internal Rotation  28   Left Hip External  Rotation  --  wfl    Left Hip Internal Rotation  30   Right Knee Extension 1   Right Knee Flexion 122   Left Knee Extension 2   Left Knee Flexion 100  pain limited    Right Ankle Dorsiflexion 13   Left Ankle Dorsiflexion 11   Strength   Right Hip Flexion 4/5   Right Hip Extension 4-/5   Right Hip ABduction 4-/5   Left Hip Flexion 3+/5   Left Hip Extension 4-/5   Left Hip ABduction 3+/5   Right Knee Flexion 4/5   Right Knee Extension 4/5   Left Knee Flexion 2/5   Left Knee Extension 3+/5   Right Ankle Dorsiflexion 4/5   Left Ankle Dorsiflexion 4-/5   Transfers   Five time sit to stand comments  21.49 seconds    Ambulation/Gait   Gait Comments proximal muscle weakness, reduced weight bearing L, reduced stance L/step R    6 minute walk test results    Aerobic Endurance Distance Walked 979   Endurance additional comments 6MWT; gait speed 0.15m/s                            PT Education - 01/14/15 1110    Education provided Yes   Education Details prognosis, HEP, plan of care    Person(s) Educated Patient   Methods Explanation;Handout   Comprehension Verbalized understanding;Returned demonstration          PT Short Term Goals - 01/14/15 1118    PT SHORT TERM GOAL #1   Title Patient will increase bilateral hip IR ROM by at least 10 degrees in order to improve overall functional mechanics and reduce stress on bilateral knees   Time 3   Period Weeks   Status New   PT SHORT TERM GOAL #2   Title Patient will demonstrate L knee range of motion 0-120 degrees with pain no more than 3/10 in order to improve overall functional mechanics and overall efficiency of gait/general mobility    Time 3   Period Weeks   Status New   PT SHORT TERM GOAL #3   Title Patient will experience no more than 3/10 pain at worst in order to allow her to more easily participate in functional home, family, and community based activities with minimal pain related limitations    Time  3   Period Weeks   Status New   PT SHORT TERM GOAL #4   Title Patient to be independent in correctly and consistently performing appropriate HEP, to  be updated PRN    Time 3   Period Weeks   Status New           PT Long Term Goals - 01/14/15 1129    PT LONG TERM GOAL #1   Title Patient will demonstrate strength of at least 4+/5 in all tested muscle groups in order to enhance functional stability and reduce pain during mobility based tasks    Time 6   Period Weeks   Status New   PT LONG TERM GOAL #2   Title Patient to display improved muscle power as evidenced by an ability to complete 5x sit to stand test in 12 seconds or less with pain no more than 2/10 in order to improve functional tasks such as floor to stand and stairs    Time 6   Period Weeks   Status New   PT LONG TERM GOAL #3   Title Patient to be able to ambulate at least 1265ft during 6 minute walk test with pain no more than 2/10 in order to enhance her ability to perform functional community based tasks with minimal pain related limitations    Time 6   Period Weeks   Status New   PT LONG TERM GOAL #4   Title Patient to report that she has been able to get down on the floor to play with her children for a period of at least 20 minutes with pain no more than 3/10 in order to allow her to more easily participate in family based activities    Time 6   Period Weeks   Status New   PT LONG TERM GOAL #5   Title Patient will experience no more than 3/10 pain when ascending and descending stairs with one railing and good control, no valgus moment, in order to expand her abilty to efficiently perform community mobility    Time 6   Period Weeks   Status New               Plan - 01/14/15 1113    Clinical Impression Statement Patient presents with long standing bilateral knee pain secondary to patella-femoral syndrome; she reports that her left leg is primarily the worst however in the past few months her right leg  has really strated to hurt a lot more, she believes because she is compensating with it for the L. Patient does demonstrate general muscle weakness as well as reduced gait speed, reduced ROM L knee, postural and gait imparimetnt, and reeduced functional task performance skills due to pain especially in her L knee. She also reports that she has a difficult time in getting down to the floor to play with and take care of her children especially, as she is limited by pain. Patient will benefit from skilled PT services in ordert to address functional impairemnts and assist her in reaching an optimal level of function with minimal pain.    Pt will benefit from skilled therapeutic intervention in order to improve on the following deficits Abnormal gait;Hypomobility;Decreased activity tolerance;Decreased strength;Pain;Difficulty walking;Improper body mechanics;Decreased range of motion;Impaired flexibility;Postural dysfunction   Rehab Potential Good   Clinical Impairments Affecting Rehab Potential chronic knee pain    PT Frequency 2x / week   PT Duration 6 weeks   PT Treatment/Interventions ADLs/Self Care Home Management;Cryotherapy;Gait training;Stair training;Functional mobility training;Therapeutic activities;Therapeutic exercise;Balance training;Neuromuscular re-education;Patient/family education;Manual techniques;Taping   PT Next Visit Plan review HEP and goals; functional strengthening and stretching. VMO strengthening. Consider taping for possible patella malalignment contributing  to patellofemoral pain if it is present and significant.    PT Home Exercise Plan given    Consulted and Agree with Plan of Care Patient         Problem List Patient Active Problem List   Diagnosis Date Noted  . Dysphagia, unspecified(787.20) 03/22/2013  . GERD (gastroesophageal reflux disease) 03/12/2013  . Seasonal and perennial allergic rhinitis 03/12/2013  . Generalized anxiety disorder 03/12/2013  . Insomnia  secondary to anxiety 03/12/2013  . Folliculitis [id number]  . Hoarseness, chronic 03/12/2013  . OBESITY 06/06/2008    Deniece Ree PT, DPT [facility] [address] [place], [place], [address] Phone: [phone]   Fax:  [phone]  Name: CHRISTINEMARIE ROUCH MRN: [id number] Date of Birth: 1980/10/20

## 2015-01-14 NOTE — Patient Instructions (Signed)
   STRAIGHT LEG RAISE - SLR EXTERNAL ROTATION  While lying or sitting, raise up your leg with a straight knee and your toes pointed outward.  Repeat 10 times each leg, twice a day.    HIP ABDUCTION - SIDELYING  While lying on your side, slowly raise up your top leg to the side. Keep your knee straight and maintain your toes pointed forward the entire time.   The bottom leg can be bent to stabilize your body.  Repeat 10 times each side, twice a day.    MEDICINE BALL BRIDGE  While lying on your back, raise your buttocks off the floor/bed while holding a medicine ball between your knees as shown.  Repeat 10-15 times, twice a day.

## 2015-01-15 ENCOUNTER — Ambulatory Visit (HOSPITAL_COMMUNITY): Payer: BLUE CROSS/BLUE SHIELD | Admitting: Physical Therapy

## 2015-01-15 DIAGNOSIS — Z789 Other specified health status: Secondary | ICD-10-CM

## 2015-01-15 DIAGNOSIS — M6281 Muscle weakness (generalized): Secondary | ICD-10-CM

## 2015-01-15 DIAGNOSIS — R262 Difficulty in walking, not elsewhere classified: Secondary | ICD-10-CM

## 2015-01-15 DIAGNOSIS — M25562 Pain in left knee: Principal | ICD-10-CM

## 2015-01-15 DIAGNOSIS — M25561 Pain in right knee: Secondary | ICD-10-CM

## 2015-01-15 DIAGNOSIS — M25662 Stiffness of left knee, not elsewhere classified: Secondary | ICD-10-CM

## 2015-01-15 DIAGNOSIS — R6889 Other general symptoms and signs: Secondary | ICD-10-CM

## 2015-01-15 NOTE — Patient Instructions (Signed)
Heel Raise: Bilateral (Standing)    Rise on balls of feet. Repeat _10___ times per set. Do ___1_ sets per session. Do __2__ sessions per day.  http://orth.exer.us/38   Copyright  VHI. All rights reserved.  Functional Quadriceps: Chair Squat    Keeping feet flat on floor, shoulder width apart, squat as low as is comfortable. Use support as necessary. Repeat _10___ times per set. Do _1___ sets per session. Do ___2_ sessions per day.  http://orth.exer.us/736   Copyright  VHI. All rights reserved.  Strengthening: Knee Flexion (Standing)    With support, bend right knee as far as possible. Repeat 10____ times per set. Do __1__ sets per session. Do __2__ sessions per day.  http://orth.exer.us/628   Copyright  VHI. All rights reserved.  Balance: Unilateral    Attempt to balance on left leg, eyes open. Hold _20-30___ seconds. Repeat __5__ times per set. Do _1___ sets per session. Do _2___ sessions per day. Perform exercise with eyes closed.  http://orth.exer.us/28   Copyright  VHI. All rights reserved.  Strengthening: Quadriceps Set    Tighten muscles on top of thighs by pushing knees down into surface. Hold __3-5__ seconds. Repeat __10__ times per set. Do _1___ sets per session. Do _2___ sessions per day.  http://orth.exer.us/602   Copyright  VHI. All rights reserved.  Strengthening: Terminal Knee Extension (Supine)   Using 4# weight  With right knee over bolster, turn leg out slightly now straighten knee by tightening muscles on top of thigh. Keep bottom of knee on bolster. Repeat _10___ times per set. Do __1__ sets per session. Do ___2_ sessions per day.  http://orth.exer.us/626   Copyright  VHI. All rights reserved.

## 2015-01-15 NOTE — Therapy (Signed)
[place], [place], [address] Phone: [phone]   Fax:  [phone]  Physical Therapy Treatment  Patient Details  Name: [name] MRN: [id number] Date of Birth: 02-25-1980 Referring Provider: Dr. Marchia Bond   Encounter Date: 01/15/2015      PT End of Session - 01/15/15 0841    Visit Number 2   Number of Visits 12   Date for PT Re-Evaluation 02/11/15   Authorization Type BCBS    Authorization Time Period 01/14/15 to 03/17/15   PT Start Time 0810  Pt late for appointment    PT Stop Time 0845   PT Time Calculation (min) 35 min   Activity Tolerance Patient tolerated treatment well      Past Medical History  Diagnosis Date  . No pertinent past medical history   . Anxiety     Past Surgical History  Procedure Laterality Date  . Tonsillectomy    . Wisdom tooth extraction    . Laparoscopic assisted vaginal hysterectomy  12/13/2011    Procedure: LAPAROSCOPIC ASSISTED VAGINAL HYSTERECTOMY;  Surgeon: Luz Lex, MD;  Location: [facility];  Service: Gynecology;  Laterality: N/A;  . Abdominal hysterectomy  2013  . Cesarean section  2012    twins  . Esophagogastroduodenoscopy N/A 05/02/2013    Procedure: ESOPHAGOGASTRODUODENOSCOPY (EGD);  Surgeon: Rogene Houston, MD;  [facility];  Service: Endoscopy;  Laterality: N/A;  230    There were no vitals filed for this visit.  Visit Diagnosis:  Knee pain, bilateral  Muscle weakness  Difficulty walking  Difficulty navigating stairs  Knee stiffness, left      Subjective Assessment - 01/15/15 0848    Subjective Pt states that she has done the exercises given to her and has no questions.    Currently in Pain? Yes   Pain Score 5    Pain Location Knee   Pain Orientation Right;Left   Pain Descriptors / Indicators Aching             [facility] Adult PT Treatment/Exercise - 01/15/15 0820    Exercises   Exercises Knee/Hip   Knee/Hip Exercises:  Standing   Heel Raises Both;10 reps   Knee Flexion Strengthening;Both;10 reps   Functional Squat 10 reps   SLS 5 x B    Knee/Hip Exercises: Supine   Quad Sets Strengthening;Both;10 reps   Short Arc Quad Sets Strengthening;Both;10 reps   Short Arc Quad Sets Limitations with English as a second language teacher 10 reps   Straight Leg Raise with External Rotation Strengthening;Both;10 reps   Knee/Hip Exercises: Prone   Hamstring Curl 10 reps   Hip Extension Both;10 reps   Other Prone Exercises heel squeeze                 PT Education - 01/15/15 [phone]    Education provided Yes   Education Details new exercises    Person(s) Educated Patient   Methods Explanation;Demonstration;Verbal cues;Handout   Comprehension Verbalized understanding;Returned demonstration          PT Short Term Goals - 01/14/15 1118    PT SHORT TERM GOAL #1   Title Patient will increase bilateral hip IR ROM by at least 10 degrees in order to improve overall functional mechanics and reduce stress on bilateral knees   Time 3   Period Weeks   Status New   PT SHORT TERM GOAL #2   Title Patient will demonstrate L knee range of  motion 0-120 degrees with pain no more than 3/10 in order to improve overall functional mechanics and overall efficiency of gait/general mobility    Time 3   Period Weeks   Status New   PT SHORT TERM GOAL #3   Title Patient will experience no more than 3/10 pain at worst in order to allow her to more easily participate in functional home, family, and community based activities with minimal pain related limitations    Time 3   Period Weeks   Status New   PT SHORT TERM GOAL #4   Title Patient to be independent in correctly and consistently performing appropriate HEP, to be updated PRN    Time 3   Period Weeks   Status New           PT Long Term Goals - 01/14/15 1129    PT LONG TERM GOAL #1   Title Patient will demonstrate strength of at least 4+/5 in all tested muscle groups in  order to enhance functional stability and reduce pain during mobility based tasks    Time 6   Period Weeks   Status New   PT LONG TERM GOAL #2   Title Patient to display improved muscle power as evidenced by an ability to complete 5x sit to stand test in 12 seconds or less with pain no more than 2/10 in order to improve functional tasks such as floor to stand and stairs    Time 6   Period Weeks   Status New   PT LONG TERM GOAL #3   Title Patient to be able to ambulate at least 1262ft during 6 minute walk test with pain no more than 2/10 in order to enhance her ability to perform functional community based tasks with minimal pain related limitations    Time 6   Period Weeks   Status New   PT LONG TERM GOAL #4   Title Patient to report that she has been able to get down on the floor to play with her children for a period of at least 20 minutes with pain no more than 3/10 in order to allow her to more easily participate in family based activities    Time 6   Period Weeks   Status New   PT LONG TERM GOAL #5   Title Patient will experience no more than 3/10 pain when ascending and descending stairs with one railing and good control, no valgus moment, in order to expand her abilty to efficiently perform community mobility    Time [place] - 01/15/15 [id number]    Clinical Impression Statement Pt and therapist reviewed initial evaluation.  Therapist instructed pt on new exercises focusing on stability of the knee. Exercises were completed with therapist faciliation for proper technique.  Noted tight hamstrings but time did not allow for supine or standing hamstring stretch this session.     PT Next Visit Plan Begin standing and supine hamstring stretches as well as step ups.   May tape is pt is in a high amount of pain         Problem List Patient Active Problem List   Diagnosis Date Noted  . Dysphagia, unspecified(787.20) 03/22/2013  . GERD  (gastroesophageal reflux disease) 03/12/2013  . Seasonal and perennial allergic rhinitis 03/12/2013  . Generalized anxiety disorder 03/12/2013  . Insomnia secondary to anxiety 03/12/2013  .  Folliculitis 123XX123  . Hoarseness, chronic 03/12/2013  . OBESITY 06/06/2008    Rayetta Humphrey, PT CLT [phone] 01/15/2015, 8:48 AM  [facility] [address] [place], [place], [address] Phone: [phone]   Fax:  [phone]  Name: CONNI BROUILLARD MRN: [id number] Date of Birth: 05/21/1980

## 2015-01-21 ENCOUNTER — Ambulatory Visit (HOSPITAL_COMMUNITY): Payer: BLUE CROSS/BLUE SHIELD | Admitting: Physical Therapy

## 2015-01-21 DIAGNOSIS — R6889 Other general symptoms and signs: Secondary | ICD-10-CM

## 2015-01-21 DIAGNOSIS — R262 Difficulty in walking, not elsewhere classified: Secondary | ICD-10-CM

## 2015-01-21 DIAGNOSIS — M25562 Pain in left knee: Principal | ICD-10-CM

## 2015-01-21 DIAGNOSIS — M25561 Pain in right knee: Secondary | ICD-10-CM | POA: Diagnosis not present

## 2015-01-21 DIAGNOSIS — Z789 Other specified health status: Secondary | ICD-10-CM

## 2015-01-21 DIAGNOSIS — M6281 Muscle weakness (generalized): Secondary | ICD-10-CM

## 2015-01-21 DIAGNOSIS — M25662 Stiffness of left knee, not elsewhere classified: Secondary | ICD-10-CM

## 2015-01-21 NOTE — Therapy (Signed)
[place], [place], [address] Phone: [phone]   Fax:  [phone]  Physical Therapy Treatment  Patient Details  Name: [name] MRN: [id number] Date of Birth: [birth year] Referring Provider: Dr. Marchia Bond   Encounter Date: 01/21/2015      PT End of Session - 01/21/15 0831    Visit Number 3   Number of Visits 12   Date for PT Re-Evaluation 02/11/15   Authorization Type BCBS    Authorization Time Period 01/14/15 to 03/17/15   PT Start Time 0803   PT Stop Time 0845   PT Time Calculation (min) 42 min   Activity Tolerance Patient tolerated treatment well   Behavior During Therapy [facility] for tasks assessed/performed      Past Medical History  Diagnosis Date  . No pertinent past medical history   . Anxiety     Past Surgical History  Procedure Laterality Date  . Tonsillectomy    . Wisdom tooth extraction    . Laparoscopic assisted vaginal hysterectomy  12/13/2011    Procedure: LAPAROSCOPIC ASSISTED VAGINAL HYSTERECTOMY;  Surgeon: Luz Lex, MD;  Location: [facility];  Service: Gynecology;  Laterality: N/A;  . Abdominal hysterectomy  2013  . Cesarean section  2012    twins  . Esophagogastroduodenoscopy N/A 05/02/2013    Procedure: ESOPHAGOGASTRODUODENOSCOPY (EGD);  Surgeon: Rogene Houston, MD;  [facility];  Service: Endoscopy;  Laterality: N/A;  230    There were no vitals filed for this visit.  Visit Diagnosis:  Knee pain, bilateral  Muscle weakness  Difficulty walking  Difficulty navigating stairs  Knee stiffness, left      Subjective Assessment - 01/21/15 0809    Subjective Pt states that her Lt knee bothers her more than her right knee.   Currently in Pain? Yes   Pain Score 5    Pain Location Knee   Pain Orientation Right;Left   Pain Descriptors / Indicators Aching   Pain Type Chronic pain                 [facility] Adult PT Treatment/Exercise - 01/21/15 0812    Exercises   Exercises Knee/Hip   Knee/Hip Exercises: Stretches   Active Hamstring Stretch Both;2 reps;20 seconds   Passive Hamstring Stretch Both;2 reps;20 seconds   Passive Hamstring Stretch Limitations long sitting    Knee/Hip Exercises: Standing   Heel Raises Both;15 reps   Terminal Knee Extension Strengthening;Both;10 reps   Forward Step Up Both;Step Height: 4"   Functional Squat 15 reps   SLS B x 3 30"each    Knee/Hip Exercises: Supine   Quad Sets Both;10 reps   Short Arc Quad Sets Strengthening;Both;10 reps;Limitations   Short Arc Quad Sets Limitations externally rotated with 4#    Bridges 10 reps   Straight Leg Raise with External Rotation Limitations x10   Knee/Hip Exercises: Prone   Hamstring Curl 10 reps   Hamstring Curl Limitations 4#   Other Prone Exercises heel squeeze                   PT Short Term Goals - 01/21/15 [id number]    PT SHORT TERM GOAL #1   Title Patient will increase bilateral hip IR ROM by at least 10 degrees in order to improve overall functional mechanics and reduce stress on bilateral knees   Time 3   Period Weeks   Status On-going   PT SHORT TERM GOAL #2  Title Patient will demonstrate L knee range of motion 0-120 degrees with pain no more than 3/10 in order to improve overall functional mechanics and overall efficiency of gait/general mobility    Time 3   Period Weeks   Status On-going   PT SHORT TERM GOAL #3   Title Patient will experience no more than 3/10 pain at worst in order to allow her to more easily participate in functional home, family, and community based activities with minimal pain related limitations    Time 3   Period Weeks   Status On-going   PT SHORT TERM GOAL #4   Title Patient to be independent in correctly and consistently performing appropriate HEP, to be updated PRN    Time 3   Period Weeks   Status Achieved           PT Long Term Goals - 01/21/15 [id number]    PT LONG TERM GOAL #1   Title Patient will  demonstrate strength of at least 4+/5 in all tested muscle groups in order to enhance functional stability and reduce pain during mobility based tasks    Time 6   Period Weeks   Status On-going   PT LONG TERM GOAL #2   Title Patient to display improved muscle power as evidenced by an ability to complete 5x sit to stand test in 12 seconds or less with pain no more than 2/10 in order to improve functional tasks such as floor to stand and stairs    Time 6   Period Weeks   Status On-going   PT LONG TERM GOAL #3   Title Patient to be able to ambulate at least 1247ft during 6 minute walk test with pain no more than 2/10 in order to enhance her ability to perform functional community based tasks with minimal pain related limitations    Time 6   Period Weeks   Status On-going   PT LONG TERM GOAL #4   Time 6   Period Weeks   Status On-going   PT LONG TERM GOAL #5   Title Patient will experience no more than 3/10 pain when ascending and descending stairs with one railing and good control, no valgus moment, in order to expand her abilty to efficiently perform community mobility    Time 6   Period Weeks   Status On-going               Plan - 01/21/15 [id number]    Clinical Impression Statement Pt instructed in new exercises with verbal cuing for proper technique. Noted quad fatigue with new exercises.  Pt will need continued strengthening especially of the VMO    Pt will benefit from skilled therapeutic intervention in order to improve on the following deficits Abnormal gait;Hypomobility;Decreased activity tolerance;Decreased strength;Pain;Difficulty walking;Improper body mechanics;Decreased range of motion;Impaired flexibility;Postural dysfunction   PT Next Visit Plan Begin lateral step up and lunges.  May tape if pt is in a high amount of pain.         Problem List Patient Active Problem List   Diagnosis Date Noted  . Dysphagia, unspecified(787.20) 03/22/2013  . GERD (gastroesophageal  reflux disease) 03/12/2013  . Seasonal and perennial allergic rhinitis 03/12/2013  . Generalized anxiety disorder 03/12/2013  . Insomnia secondary to anxiety 03/12/2013  . Folliculitis 123XX123  . Hoarseness, chronic 03/12/2013  . OBESITY 06/06/2008    Rayetta Humphrey, PT CLT [phone] 01/21/2015, 8:45 AM  [facility] [place],  [place], [address] Phone: [phone]   Fax:  [phone]  Name: AREEBA COCKRELL MRN: [id number] Date of Birth: 08/12/1980

## 2015-01-22 ENCOUNTER — Ambulatory Visit (HOSPITAL_COMMUNITY): Payer: BLUE CROSS/BLUE SHIELD | Admitting: Physical Therapy

## 2015-01-22 DIAGNOSIS — R262 Difficulty in walking, not elsewhere classified: Secondary | ICD-10-CM

## 2015-01-22 DIAGNOSIS — R6889 Other general symptoms and signs: Secondary | ICD-10-CM

## 2015-01-22 DIAGNOSIS — M25561 Pain in right knee: Secondary | ICD-10-CM | POA: Diagnosis not present

## 2015-01-22 DIAGNOSIS — M25562 Pain in left knee: Principal | ICD-10-CM

## 2015-01-22 DIAGNOSIS — M6281 Muscle weakness (generalized): Secondary | ICD-10-CM

## 2015-01-22 DIAGNOSIS — M25662 Stiffness of left knee, not elsewhere classified: Secondary | ICD-10-CM

## 2015-01-22 DIAGNOSIS — Z789 Other specified health status: Secondary | ICD-10-CM

## 2015-01-22 NOTE — Therapy (Signed)
[place], [place], [address] Phone: [phone]   Fax:  [phone]  Physical Therapy Treatment  Patient Details  Name: [name] MRN: [id number] Date of Birth: February 24, 1980 Referring Provider: Dr. Marchia Bond   Encounter Date: 01/22/2015      PT End of Session - 01/22/15 1754    Visit Number 4   Number of Visits 12   Date for PT Re-Evaluation 02/11/15   Authorization Type BCBS    Authorization Time Period 01/14/15 to 03/17/15   PT Start Time 1305  pt was 5 minutes late   PT Stop Time 1345   PT Time Calculation (min) 40 min   Activity Tolerance Patient tolerated treatment well   Behavior During Therapy [facility] for tasks assessed/performed      Past Medical History  Diagnosis Date  . No pertinent past medical history   . Anxiety     Past Surgical History  Procedure Laterality Date  . Tonsillectomy    . Wisdom tooth extraction    . Laparoscopic assisted vaginal hysterectomy  12/13/2011    Procedure: LAPAROSCOPIC ASSISTED VAGINAL HYSTERECTOMY;  Surgeon: Luz Lex, MD;  Location: [facility];  Service: Gynecology;  Laterality: N/A;  . Abdominal hysterectomy  2013  . Cesarean section  2012    twins  . Esophagogastroduodenoscopy N/A 05/02/2013    Procedure: ESOPHAGOGASTRODUODENOSCOPY (EGD);  Surgeon: Rogene Houston, MD;  [facility];  Service: Endoscopy;  Laterality: N/A;  230    There were no vitals filed for this visit.  Visit Diagnosis:  Knee pain, bilateral  Muscle weakness  Difficulty walking  Difficulty navigating stairs  Knee stiffness, left      Subjective Assessment - 01/22/15 1752    Subjective PT states she is doing well today without pain.   Currently in Pain? No/denies                         [facility] Adult PT Treatment/Exercise - 01/22/15 1311    Knee/Hip Exercises: Stretches   Active Hamstring Stretch Both;3 reps;30 seconds   Active Hamstring Stretch  Limitations standing on 14" step   Other Knee/Hip Stretches slant board 3X30"   Knee/Hip Exercises: Standing   Heel Raises Both;15 reps   Forward Lunges Both;2 sets;10 reps   Forward Lunges Limitations onto 6" step   Lateral Step Up Both;10 reps;Step Height: 6";Hand Hold: 1   Lateral Step Up Limitations 6"   Forward Step Up Both;Step Height: 6";Hand Hold: 1;10 reps   Forward Step Up Limitations 6"   Knee/Hip Exercises: Supine   Short Arc Quad Sets Strengthening;Both;Limitations;15 reps   Short Arc Quad Sets Limitations 1 set neutral, 1 set ER 5#   Bridges 15 reps   Straight Leg Raises Both;1 set;10 reps   Straight Leg Raise with External Rotation Both;1 set;10 reps   Knee/Hip Exercises: Prone   Hamstring Curl 15 reps   Hamstring Curl Limitations 5#   Hip Extension Both;15 reps                  PT Short Term Goals - 01/21/15 [id number]    PT SHORT TERM GOAL #1   Title Patient will increase bilateral hip IR ROM by at least 10 degrees in order to improve overall functional mechanics and reduce stress on bilateral knees   Time 3   Period Weeks   Status On-going   PT SHORT TERM GOAL #2  Title Patient will demonstrate L knee range of motion 0-120 degrees with pain no more than 3/10 in order to improve overall functional mechanics and overall efficiency of gait/general mobility    Time 3   Period Weeks   Status On-going   PT SHORT TERM GOAL #3   Title Patient will experience no more than 3/10 pain at worst in order to allow her to more easily participate in functional home, family, and community based activities with minimal pain related limitations    Time 3   Period Weeks   Status On-going   PT SHORT TERM GOAL #4   Title Patient to be independent in correctly and consistently performing appropriate HEP, to be updated PRN    Time 3   Period Weeks   Status Achieved           PT Long Term Goals - 01/21/15 [id number]    PT LONG TERM GOAL #1   Title Patient will demonstrate  strength of at least 4+/5 in all tested muscle groups in order to enhance functional stability and reduce pain during mobility based tasks    Time 6   Period Weeks   Status On-going   PT LONG TERM GOAL #2   Title Patient to display improved muscle power as evidenced by an ability to complete 5x sit to stand test in 12 seconds or less with pain no more than 2/10 in order to improve functional tasks such as floor to stand and stairs    Time 6   Period Weeks   Status On-going   PT LONG TERM GOAL #3   Title Patient to be able to ambulate at least 1241ft during 6 minute walk test with pain no more than 2/10 in order to enhance her ability to perform functional community based tasks with minimal pain related limitations    Time 6   Period Weeks   Status On-going   PT LONG TERM GOAL #4   Time 6   Period Weeks   Status On-going   PT LONG TERM GOAL #5   Title Patient will experience no more than 3/10 pain when ascending and descending stairs with one railing and good control, no valgus moment, in order to expand her abilty to efficiently perform community mobility    Time 6   Period Weeks   Status On-going               Plan - 01/22/15 1754    Clinical Impression Statement Progressed therex today adding forward lunges, step ups with 6" step and increasing reps.  Instructed with standing hamstring stretch and gastroc stretch with good stretch obtained.  PT able to complete all exericses without complaitns today.  Progressing well.    Pt will benefit from skilled therapeutic intervention in order to improve on the following deficits Abnormal gait;Hypomobility;Decreased activity tolerance;Decreased strength;Pain;Difficulty walking;Improper body mechanics;Decreased range of motion;Impaired flexibility;Postural dysfunction   PT Next Visit Plan Continue to progress functional strength.  Add forward step downs and lateral lunges next session.         Problem List Patient Active Problem  List   Diagnosis Date Noted  . Dysphagia, unspecified(787.20) 03/22/2013  . GERD (gastroesophageal reflux disease) 03/12/2013  . Seasonal and perennial allergic rhinitis 03/12/2013  . Generalized anxiety disorder 03/12/2013  . Insomnia secondary to anxiety 03/12/2013  . Folliculitis 123XX123  . Hoarseness, chronic 03/12/2013  . OBESITY 06/06/2008    Teena Irani, PTA/CLT [phone]  01/22/2015, 5:56 PM  [facility]  [place], [place], [address] Phone: [phone]   Fax:  [phone]  Name: [name] MRN: [id number] Date of Birth: March 23, 1980

## 2015-01-28 ENCOUNTER — Ambulatory Visit (HOSPITAL_COMMUNITY): Payer: BLUE CROSS/BLUE SHIELD

## 2015-01-28 DIAGNOSIS — M25561 Pain in right knee: Secondary | ICD-10-CM | POA: Diagnosis not present

## 2015-01-28 DIAGNOSIS — M6281 Muscle weakness (generalized): Secondary | ICD-10-CM

## 2015-01-28 DIAGNOSIS — Z789 Other specified health status: Secondary | ICD-10-CM

## 2015-01-28 DIAGNOSIS — M25662 Stiffness of left knee, not elsewhere classified: Secondary | ICD-10-CM

## 2015-01-28 DIAGNOSIS — M25562 Pain in left knee: Principal | ICD-10-CM

## 2015-01-28 DIAGNOSIS — R262 Difficulty in walking, not elsewhere classified: Secondary | ICD-10-CM

## 2015-01-28 DIAGNOSIS — R6889 Other general symptoms and signs: Secondary | ICD-10-CM

## 2015-01-28 NOTE — Therapy (Signed)
[place], [place], [address] Phone: [phone]   Fax:  [phone]  Physical Therapy Treatment  Patient Details  Name: [name] MRN: [id number] Date of Birth: 1980-05-06 Referring Provider: Leandro Reasoner  Encounter Date: 01/28/2015      PT End of Session - 01/28/15 0809    Visit Number 5   Number of Visits 12   Date for PT Re-Evaluation 02/11/15   Authorization Type BCBS    Authorization Time Period 01/14/15 to 03/17/15   PT Start Time 0803   PT Stop Time 0845   PT Time Calculation (min) 42 min   Activity Tolerance Patient tolerated treatment well   Behavior During Therapy [facility] for tasks assessed/performed      Past Medical History  Diagnosis Date  . No pertinent past medical history   . Anxiety     Past Surgical History  Procedure Laterality Date  . Tonsillectomy    . Wisdom tooth extraction    . Laparoscopic assisted vaginal hysterectomy  12/13/2011    Procedure: LAPAROSCOPIC ASSISTED VAGINAL HYSTERECTOMY;  Surgeon: Luz Lex, MD;  Location: [facility];  Service: Gynecology;  Laterality: N/A;  . Abdominal hysterectomy  2013  . Cesarean section  2012    twins  . Esophagogastroduodenoscopy N/A 05/02/2013    Procedure: ESOPHAGOGASTRODUODENOSCOPY (EGD);  Surgeon: Rogene Houston, MD;  [facility];  Service: Endoscopy;  Laterality: N/A;  230    There were no vitals filed for this visit.  Visit Diagnosis:  Knee pain, bilateral  Muscle weakness  Difficulty walking  Difficulty navigating stairs  Knee stiffness, left      Subjective Assessment - 01/28/15 0806    Subjective Pt stated knee pain scale 4/10 aching today, reports compliance with HEP daily   Pertinent History Patient reports that she has always had trouble with her knees but over the past year things have just gotten a lot worse. L has always been worse, has been swelling and displayed painful pops. R knee really  started bothering her about 6 months ago with same general symptoms.    Patient Stated Goals get rid of pain, be able to be more active with kids ( 45 year old and a set of 16 year old twins)   Currently in Pain? Yes   Pain Score 4    Pain Location Knee   Pain Orientation Left   Pain Descriptors / Indicators Aching            [facility] PT Assessment - 01/28/15 0001    Assessment   Medical Diagnosis B patella femoral syndrome    Referring Provider Dr.Joshua Landau   Onset Date/Surgical Date --  chronic   Next MD Visit follow-up after therapy is done    Precautions   Precautions None           [facility] Adult PT Treatment/Exercise - 01/28/15 0001    Knee/Hip Exercises: Stretches   Active Hamstring Stretch Both;3 reps;30 seconds   Active Hamstring Stretch Limitations standing on 14" step   Piriformis Stretch Both;3 reps;30 seconds   Piriformis Stretch Limitations seated stretch   Other Knee/Hip Stretches slant board 3X30"   Knee/Hip Exercises: Standing   Forward Lunges Both;10 reps   Forward Lunges Limitations onto 6" step   Side Lunges Both;10 reps   Side Lunges Limitations onto 6in step   Lateral Step Up Both;10 reps;Step Height: 6";Hand Hold: 1   Lateral Step Up Limitations 6"  Forward Step Up Both;Step Height: 6";Hand Hold: 1;10 reps   Forward Step Up Limitations 6"   Step Down Both;10 reps;Hand Hold: 1;Step Height: 4"   Functional Squat 15 reps   Wall Squat 5 reps;5 seconds   Wall Squat Limitations ball between knees   SLS Rt 32", Lt 30" max of 3   Knee/Hip Exercises: Supine   Short Arc Quad Sets Both;15 reps   Short Arc Quad Sets Limitations 1 set neutral 2nd set with ER 5#   Patellar Mobs instructed all directions                PT Education - 01/28/15 [phone]    Education provided Yes   Education Details educated and instructed patella mobs to reduce knee popping   Person(s) Educated Patient   Methods Explanation;Demonstration   Comprehension Verbalized  understanding;Returned demonstration          PT Short Term Goals - 01/21/15 0835    PT SHORT TERM GOAL #1   Title Patient will increase bilateral hip IR ROM by at least 10 degrees in order to improve overall functional mechanics and reduce stress on bilateral knees   Time 3   Period Weeks   Status On-going   PT SHORT TERM GOAL #2   Title Patient will demonstrate L knee range of motion 0-120 degrees with pain no more than 3/10 in order to improve overall functional mechanics and overall efficiency of gait/general mobility    Time 3   Period Weeks   Status On-going   PT SHORT TERM GOAL #3   Title Patient will experience no more than 3/10 pain at worst in order to allow her to more easily participate in functional home, family, and community based activities with minimal pain related limitations    Time 3   Period Weeks   Status On-going   PT SHORT TERM GOAL #4   Title Patient to be independent in correctly and consistently performing appropriate HEP, to be updated PRN    Time 3   Period Weeks   Status Achieved           PT Long Term Goals - 01/21/15 [id number]    PT LONG TERM GOAL #1   Title Patient will demonstrate strength of at least 4+/5 in all tested muscle groups in order to enhance functional stability and reduce pain during mobility based tasks    Time 6   Period Weeks   Status On-going   PT LONG TERM GOAL #2   Title Patient to display improved muscle power as evidenced by an ability to complete 5x sit to stand test in 12 seconds or less with pain no more than 2/10 in order to improve functional tasks such as floor to stand and stairs    Time 6   Period Weeks   Status On-going   PT LONG TERM GOAL #3   Title Patient to be able to ambulate at least 1260ft during 6 minute walk test with pain no more than 2/10 in order to enhance her ability to perform functional community based tasks with minimal pain related limitations    Time 6   Period Weeks   Status On-going   PT  LONG TERM GOAL #4   Time 6   Period Weeks   Status On-going   PT LONG TERM GOAL #5   Title Patient will experience no more than 3/10 pain when ascending and descending stairs with one railing and good control, no valgus moment,  in order to expand her abilty to efficiently perform community mobility    Time 6   Period Weeks   Status On-going               Plan - 01/28/15 0820    Clinical Impression Statement Session focus on improving functional strengthening with addition of side lunges and forward step downs to improve eccentric quad control.  Added piriformis stretches to improve hip mobility and instructed patella mobs to improve patella tracking and reduce knee popping during gait and sit to stands.  Added VMO strengthening exercises with good form demonstrated.  Pt able to complete all exercises with minimal cueing for form and technique.  No c/o increased pain through session and pain free at end of session.   PT Next Visit Plan Continue to progress functional strength.  Continue VMO strengthening and F/U with knee popping, may benefit from kinesiotaping.  Added standing abduction strengthening exercises next session.          Problem List Patient Active Problem List   Diagnosis Date Noted  . Dysphagia, unspecified(787.20) 03/22/2013  . GERD (gastroesophageal reflux disease) 03/12/2013  . Seasonal and perennial allergic rhinitis 03/12/2013  . Generalized anxiety disorder 03/12/2013  . Insomnia secondary to anxiety 03/12/2013  . Folliculitis 123XX123  . Hoarseness, chronic 03/12/2013  . OBESITY 06/06/2008   Ihor Austin, [place]; [place]  Aldona Lento 01/28/2015, 8:51 AM  [place], [place], [address] Phone: [phone]   Fax:  [phone]  Name: HENDRIX KULBACKI MRN: [id number] Date of Birth: 10-17-1980

## 2015-01-30 ENCOUNTER — Ambulatory Visit (HOSPITAL_COMMUNITY): Payer: BLUE CROSS/BLUE SHIELD

## 2015-01-30 DIAGNOSIS — M25561 Pain in right knee: Secondary | ICD-10-CM | POA: Diagnosis not present

## 2015-01-30 DIAGNOSIS — M25662 Stiffness of left knee, not elsewhere classified: Secondary | ICD-10-CM

## 2015-01-30 DIAGNOSIS — M25562 Pain in left knee: Principal | ICD-10-CM

## 2015-01-30 DIAGNOSIS — M6281 Muscle weakness (generalized): Secondary | ICD-10-CM

## 2015-01-30 DIAGNOSIS — Z789 Other specified health status: Secondary | ICD-10-CM

## 2015-01-30 DIAGNOSIS — R262 Difficulty in walking, not elsewhere classified: Secondary | ICD-10-CM

## 2015-01-30 DIAGNOSIS — R6889 Other general symptoms and signs: Secondary | ICD-10-CM

## 2015-01-30 NOTE — Therapy (Signed)
[place], [place], [address] Phone: [phone]   Fax:  [phone]  Physical Therapy Treatment  Patient Details  Name: [name] MRN: [id number] Date of Birth: 07-26-1980 Referring Provider: Leandro Reasoner  Encounter Date: 01/30/2015      PT End of Session - 01/30/15 0812    Visit Number 6   Number of Visits 12   Date for PT Re-Evaluation 02/11/15   Authorization Type BCBS    Authorization Time Period 01/14/15 to 03/17/15   PT Start Time 0808   PT Stop Time 0846   PT Time Calculation (min) 38 min   Activity Tolerance Patient tolerated treatment well   Behavior During Therapy [facility] for tasks assessed/performed      Past Medical History  Diagnosis Date  . No pertinent past medical history   . Anxiety     Past Surgical History  Procedure Laterality Date  . Tonsillectomy    . Wisdom tooth extraction    . Laparoscopic assisted vaginal hysterectomy  12/13/2011    Procedure: LAPAROSCOPIC ASSISTED VAGINAL HYSTERECTOMY;  Surgeon: Luz Lex, MD;  Location: [facility];  Service: Gynecology;  Laterality: N/A;  . Abdominal hysterectomy  2013  . Cesarean section  2012    twins  . Esophagogastroduodenoscopy N/A 05/02/2013    Procedure: ESOPHAGOGASTRODUODENOSCOPY (EGD);  Surgeon: Rogene Houston, MD;  [facility];  Service: Endoscopy;  Laterality: N/A;  230    There were no vitals filed for this visit.  Visit Diagnosis:  Knee pain, bilateral  Muscle weakness  Difficulty walking  Difficulty navigating stairs  Knee stiffness, left      Subjective Assessment - 01/30/15 0806    Subjective Pt stated Lt knee is feeling good today, reported Rt LE was hurting yesterday.  Pt reports she has been completeing patella mobs at home and reports decreased knee popping during gait and sit to stands.   Pertinent History Patient reports that she has always had trouble with her knees but over the past year  things have just gotten a lot worse. L has always been worse, has been swelling and displayed painful pops. R knee really started bothering her about 6 months ago with same general symptoms.    Patient Stated Goals get rid of pain, be able to be more active with kids ( 70 year old and a set of 28 year old twins)   Currently in Pain? No/denies          [facility] Adult PT Treatment/Exercise - 01/30/15 0001    Knee/Hip Exercises: Standing   Forward Lunges Both;15 reps   Forward Lunges Limitations onto 6" step   Side Lunges Both;10 reps   Side Lunges Limitations onto 6in step   Terminal Knee Extension Strengthening;Both;10 reps;Theraband  RTB   Lateral Step Up Both;15 reps;Step Height: 6"   Forward Step Up Both;Step Height: 6";Hand Hold: 1;15 reps   Step Down Both;10 reps;Hand Hold: 1;Step Height: 4"   Functional Squat 15 reps   Wall Squat 10 reps;5 reps   Wall Squat Limitations ball between knees   Rocker Board 2 minutes   Rocker Board Limitations R/L   SLS Rt 29", Lt 36"   Other Standing Knee Exercises sidestepping green tband   Knee/Hip Exercises: Supine   Short Arc Quad Sets Both;15 reps   Short Arc Quad Sets Limitations 2 sets: 1st neutral 2nd with ER 5#   Patellar Mobs Reports compliance   Knee/Hip Exercises:  Prone   Hamstring Curl 15 reps   Hamstring Curl Limitations 5#   Hip Extension Both;15 reps                  PT Short Term Goals - 01/21/15 0835    PT SHORT TERM GOAL #1   Title Patient will increase bilateral hip IR ROM by at least 10 degrees in order to improve overall functional mechanics and reduce stress on bilateral knees   Time 3   Period Weeks   Status On-going   PT SHORT TERM GOAL #2   Title Patient will demonstrate L knee range of motion 0-120 degrees with pain no more than 3/10 in order to improve overall functional mechanics and overall efficiency of gait/general mobility    Time 3   Period Weeks   Status On-going   PT SHORT TERM GOAL #3   Title  Patient will experience no more than 3/10 pain at worst in order to allow her to more easily participate in functional home, family, and community based activities with minimal pain related limitations    Time 3   Period Weeks   Status On-going   PT SHORT TERM GOAL #4   Title Patient to be independent in correctly and consistently performing appropriate HEP, to be updated PRN    Time 3   Period Weeks   Status Achieved           PT Long Term Goals - 01/21/15 [id number]    PT LONG TERM GOAL #1   Title Patient will demonstrate strength of at least 4+/5 in all tested muscle groups in order to enhance functional stability and reduce pain during mobility based tasks    Time 6   Period Weeks   Status On-going   PT LONG TERM GOAL #2   Title Patient to display improved muscle power as evidenced by an ability to complete 5x sit to stand test in 12 seconds or less with pain no more than 2/10 in order to improve functional tasks such as floor to stand and stairs    Time 6   Period Weeks   Status On-going   PT LONG TERM GOAL #3   Title Patient to be able to ambulate at least 1216ft during 6 minute walk test with pain no more than 2/10 in order to enhance her ability to perform functional community based tasks with minimal pain related limitations    Time 6   Period Weeks   Status On-going   PT LONG TERM GOAL #4   Time 6   Period Weeks   Status On-going   PT LONG TERM GOAL #5   Title Patient will experience no more than 3/10 pain when ascending and descending stairs with one railing and good control, no valgus moment, in order to expand her abilty to efficiently perform community mobility    Time 6   Period Weeks   Status On-going               Plan - 01/30/15 0837    Clinical Impression Statement Session focus on progressing functional strengthening with addition of rocker board to improve weight distribution with gait, side stepping for glut med strengthening and ability to increased  reps with VMO focused strengthening exercises.  Pt able to complete all exercises correctly following demonstration and cueing for techniuqe.  No reports of pain through session.   PT Next Visit Plan Continue to progress functional strength.  Continue VMO strengthening and F/U  with knee popping, may benefit from kinesiotaping.          Problem List Patient Active Problem List   Diagnosis Date Noted  . Dysphagia, unspecified(787.20) 03/22/2013  . GERD (gastroesophageal reflux disease) 03/12/2013  . Seasonal and perennial allergic rhinitis 03/12/2013  . Generalized anxiety disorder 03/12/2013  . Insomnia secondary to anxiety 03/12/2013  . Folliculitis 123XX123  . Hoarseness, chronic 03/12/2013  . OBESITY 06/06/2008   Ihor Austin, [place]; [place]  Aldona Lento 01/30/2015, 8:50 AM  [place], [place], [address] Phone: [phone]   Fax:  [phone]  Name: [name] MRN: [id number] Date of Birth: 05/23/1980

## 2015-02-05 ENCOUNTER — Ambulatory Visit (HOSPITAL_COMMUNITY): Payer: BLUE CROSS/BLUE SHIELD | Attending: Orthopedic Surgery | Admitting: Physical Therapy

## 2015-02-05 DIAGNOSIS — Z789 Other specified health status: Secondary | ICD-10-CM

## 2015-02-05 DIAGNOSIS — Z658 Other specified problems related to psychosocial circumstances: Secondary | ICD-10-CM | POA: Diagnosis present

## 2015-02-05 DIAGNOSIS — R262 Difficulty in walking, not elsewhere classified: Secondary | ICD-10-CM | POA: Diagnosis present

## 2015-02-05 DIAGNOSIS — M6281 Muscle weakness (generalized): Secondary | ICD-10-CM | POA: Diagnosis present

## 2015-02-05 DIAGNOSIS — M25662 Stiffness of left knee, not elsewhere classified: Secondary | ICD-10-CM | POA: Insufficient documentation

## 2015-02-05 DIAGNOSIS — M25561 Pain in right knee: Secondary | ICD-10-CM | POA: Diagnosis not present

## 2015-02-05 DIAGNOSIS — M25562 Pain in left knee: Secondary | ICD-10-CM | POA: Diagnosis present

## 2015-02-05 DIAGNOSIS — R6889 Other general symptoms and signs: Secondary | ICD-10-CM

## 2015-02-05 NOTE — Therapy (Signed)
[place], [place], [address] Phone: [phone]   Fax:  [phone]  Physical Therapy Treatment  Patient Details  Name: [name] MRN: [id number] Date of Birth: 07-03-1980 Referring Provider: Leandro Reasoner  Encounter Date: 02/05/2015      PT End of Session - 02/05/15 0914    Visit Number 7   Number of Visits 12   Date for PT Re-Evaluation 02/11/15   Authorization Type BCBS    Authorization Time Period 01/14/15 to 03/17/15   PT Start Time 0803   PT Stop Time 0852   PT Time Calculation (min) 49 min   Activity Tolerance Patient tolerated treatment well   Behavior During Therapy [facility] for tasks assessed/performed      Past Medical History  Diagnosis Date  . No pertinent past medical history   . Anxiety     Past Surgical History  Procedure Laterality Date  . Tonsillectomy    . Wisdom tooth extraction    . Laparoscopic assisted vaginal hysterectomy  12/13/2011    Procedure: LAPAROSCOPIC ASSISTED VAGINAL HYSTERECTOMY;  Surgeon: Luz Lex, MD;  Location: [facility];  Service: Gynecology;  Laterality: N/A;  . Abdominal hysterectomy  2013  . Cesarean section  2012    twins  . Esophagogastroduodenoscopy N/A 05/02/2013    Procedure: ESOPHAGOGASTRODUODENOSCOPY (EGD);  Surgeon: Rogene Houston, MD;  [facility];  Service: Endoscopy;  Laterality: N/A;  230    There were no vitals filed for this visit.  Visit Diagnosis:  Knee pain, bilateral  Muscle weakness  Difficulty walking  Difficulty navigating stairs  Knee stiffness, left      Subjective Assessment - 02/05/15 0813    Subjective Pt states her Lt knee has been hurting more past few days, 5/10.  States her Rt knee is 3/10 today.  States her actvity level is the same so unsure what is causing increased pain.    Currently in Pain? Yes   Pain Score 5    Pain Location Knee   Pain Orientation Left                          [facility] Adult PT Treatment/Exercise - 02/05/15 0808    Knee/Hip Exercises: Stretches   Active Hamstring Stretch Both;3 reps;30 seconds   Active Hamstring Stretch Limitations standing on 14" step   Other Knee/Hip Stretches slant board 3X30"   Knee/Hip Exercises: Standing   Forward Lunges Both;15 reps   Forward Lunges Limitations onto 4" step   Side Lunges Both;15 reps   Side Lunges Limitations onto 4in step   Lateral Step Up Both;15 reps;Step Height: 6";2 sets   Lateral Step Up Limitations 6"   Forward Step Up Both;Step Height: 6";Hand Hold: 1;15 reps   Forward Step Up Limitations 6"   Step Down Both;10 reps;Hand Hold: 1;Step Height: 4"   Wall Squat 15 reps   Wall Squat Limitations ball between knees   Stairs 2RT 7" steps reciprocally without HR   Knee/Hip Exercises: Prone   Hamstring Curl 15 reps   Hamstring Curl Limitations 5#   Hip Extension Both;15 reps                  PT Short Term Goals - 01/21/15 [id number]    PT SHORT TERM GOAL #1   Title Patient will increase bilateral hip IR ROM by at least 10 degrees in order to improve overall functional mechanics and  reduce stress on bilateral knees   Time 3   Period Weeks   Status On-going   PT SHORT TERM GOAL #2   Title Patient will demonstrate L knee range of motion 0-120 degrees with pain no more than 3/10 in order to improve overall functional mechanics and overall efficiency of gait/general mobility    Time 3   Period Weeks   Status On-going   PT SHORT TERM GOAL #3   Title Patient will experience no more than 3/10 pain at worst in order to allow her to more easily participate in functional home, family, and community based activities with minimal pain related limitations    Time 3   Period Weeks   Status On-going   PT SHORT TERM GOAL #4   Title Patient to be independent in correctly and consistently performing appropriate HEP, to be updated PRN    Time 3   Period Weeks   Status Achieved           PT Long Term  Goals - 01/21/15 [id number]    PT LONG TERM GOAL #1   Title Patient will demonstrate strength of at least 4+/5 in all tested muscle groups in order to enhance functional stability and reduce pain during mobility based tasks    Time 6   Period Weeks   Status On-going   PT LONG TERM GOAL #2   Title Patient to display improved muscle power as evidenced by an ability to complete 5x sit to stand test in 12 seconds or less with pain no more than 2/10 in order to improve functional tasks such as floor to stand and stairs    Time 6   Period Weeks   Status On-going   PT LONG TERM GOAL #3   Title Patient to be able to ambulate at least 1224ft during 6 minute walk test with pain no more than 2/10 in order to enhance her ability to perform functional community based tasks with minimal pain related limitations    Time 6   Period Weeks   Status On-going   PT LONG TERM GOAL #4   Time 6   Period Weeks   Status On-going   PT LONG TERM GOAL #5   Title Patient will experience no more than 3/10 pain when ascending and descending stairs with one railing and good control, no valgus moment, in order to expand her abilty to efficiently perform community mobility    Time 6   Period Weeks   Status On-going               Plan - 02/05/15 0914    Clinical Impression Statement continued focus on improving functional strength of bilateral LE's.  Progressed to reciprocal stair negotiation with only reported discomfort with descending steps.  Able to complete 2 sets of therex today all without increased pain.  Overall progressing well without knee popping today.    PT Next Visit Plan Continue to progress functional strength.  Continue VMO strengthening and F/U with knee popping, may benefit from kinesiotaping.          Problem List Patient Active Problem List   Diagnosis Date Noted  . Dysphagia, unspecified(787.20) 03/22/2013  . GERD (gastroesophageal reflux disease) 03/12/2013  . Seasonal and perennial  allergic rhinitis 03/12/2013  . Generalized anxiety disorder 03/12/2013  . Insomnia secondary to anxiety 03/12/2013  . Folliculitis 123XX123  . Hoarseness, chronic 03/12/2013  . OBESITY 06/06/2008    Teena Irani, PTA/CLT [phone]  02/05/2015, 9:16  [place] [address] [place], [place], [address] Phone: [phone]   Fax:  [phone]  Name: [name] MRN: [id number] Date of Birth: 12-07-1980

## 2015-02-07 ENCOUNTER — Ambulatory Visit (HOSPITAL_COMMUNITY): Payer: BLUE CROSS/BLUE SHIELD | Admitting: Physical Therapy

## 2015-02-07 DIAGNOSIS — M25662 Stiffness of left knee, not elsewhere classified: Secondary | ICD-10-CM

## 2015-02-07 DIAGNOSIS — M25562 Pain in left knee: Principal | ICD-10-CM

## 2015-02-07 DIAGNOSIS — Z789 Other specified health status: Secondary | ICD-10-CM

## 2015-02-07 DIAGNOSIS — M6281 Muscle weakness (generalized): Secondary | ICD-10-CM

## 2015-02-07 DIAGNOSIS — M25561 Pain in right knee: Secondary | ICD-10-CM | POA: Diagnosis not present

## 2015-02-07 DIAGNOSIS — R6889 Other general symptoms and signs: Secondary | ICD-10-CM

## 2015-02-07 DIAGNOSIS — R262 Difficulty in walking, not elsewhere classified: Secondary | ICD-10-CM

## 2015-02-07 NOTE — Therapy (Signed)
[place], [place], [address] Phone: [phone]   Fax:  [phone]  Physical Therapy Treatment  Patient Details  Name: [name] MRN: [id number] Date of Birth: Dec 27, 1980 Referring Provider: Leandro Reasoner  Encounter Date: 02/07/2015      PT End of Session - 02/07/15 1018    Visit Number 8   Number of Visits 12   Date for PT Re-Evaluation 02/11/15   Authorization Time Period 01/14/15 to 03/17/15   PT Start Time 0938   PT Stop Time 1020   PT Time Calculation (min) 42 min      Past Medical History  Diagnosis Date  . No pertinent past medical history   . Anxiety     Past Surgical History  Procedure Laterality Date  . Tonsillectomy    . Wisdom tooth extraction    . Laparoscopic assisted vaginal hysterectomy  12/13/2011    Procedure: LAPAROSCOPIC ASSISTED VAGINAL HYSTERECTOMY;  Surgeon: Luz Lex, MD;  Location: [facility];  Service: Gynecology;  Laterality: N/A;  . Abdominal hysterectomy  2013  . Cesarean section  2012    twins  . Esophagogastroduodenoscopy N/A 05/02/2013    Procedure: ESOPHAGOGASTRODUODENOSCOPY (EGD);  Surgeon: Rogene Houston, MD;  [facility];  Service: Endoscopy;  Laterality: N/A;  230    There were no vitals filed for this visit.  Visit Diagnosis:  Knee pain, bilateral  Muscle weakness  Difficulty walking  Difficulty navigating stairs  Knee stiffness, left      Subjective Assessment - 02/07/15 0938    Subjective Pt has been doing her exercises she has been having increased pain.  Pain is equal Bilaterally    Currently in Pain? Yes   Pain Score 5    Pain Location Knee   Pain Orientation Right;Left   Pain Descriptors / Indicators Aching   Pain Type Chronic pain                [facility] Adult PT Treatment/Exercise - 02/07/15 0001    Knee/Hip Exercises: Standing   Heel Raises Both;15 reps   Forward Lunges Both;15 reps   Forward Lunges Limitations  onto 4" step   Side Lunges Both;15 reps   Side Lunges Limitations with 3# weight going into B 90 degree abduction    Terminal Knee Extension 10 reps  ball at wall    Lateral Step Up Both;15 reps   Lateral Step Up Limitations 6"   Forward Step Up Both;Step Height: 6";Hand Hold: 1;15 reps   Forward Step Up Limitations 6"   Step Down Both;10 reps;Hand Hold: 1;Step Height: 4"   Functional Squat 15 reps   Wall Squat 15 reps   Wall Squat Limitations ball between knees   SLS with Vectors 5" x 5 B    Other Standing Knee Exercises sidestepping blue  tband   Knee/Hip Exercises: Supine   Short Arc Quad Sets Both;10 reps   Short Arc Quad Sets Limitations neutral and ER with 71/2 #                PT Education - 02/07/15 [phone]    Education provided Yes   Education Details proper technique for exercises.    Person(s) Educated Patient   Methods Explanation;Demonstration;Verbal cues   Comprehension Verbalized understanding;Returned demonstration          PT Short Term Goals - 01/21/15 0835    PT SHORT TERM GOAL #1   Title Patient will increase bilateral  hip IR ROM by at least 10 degrees in order to improve overall functional mechanics and reduce stress on bilateral knees   Time 3   Period Weeks   Status On-going   PT SHORT TERM GOAL #2   Title Patient will demonstrate L knee range of motion 0-120 degrees with pain no more than 3/10 in order to improve overall functional mechanics and overall efficiency of gait/general mobility    Time 3   Period Weeks   Status On-going   PT SHORT TERM GOAL #3   Title Patient will experience no more than 3/10 pain at worst in order to allow her to more easily participate in functional home, family, and community based activities with minimal pain related limitations    Time 3   Period Weeks   Status On-going   PT SHORT TERM GOAL #4   Title Patient to be independent in correctly and consistently performing appropriate HEP, to be updated PRN     Time 3   Period Weeks   Status Achieved           PT Long Term Goals - 01/21/15 [id number]    PT LONG TERM GOAL #1   Title Patient will demonstrate strength of at least 4+/5 in all tested muscle groups in order to enhance functional stability and reduce pain during mobility based tasks    Time 6   Period Weeks   Status On-going   PT LONG TERM GOAL #2   Title Patient to display improved muscle power as evidenced by an ability to complete 5x sit to stand test in 12 seconds or less with pain no more than 2/10 in order to improve functional tasks such as floor to stand and stairs    Time 6   Period Weeks   Status On-going   PT LONG TERM GOAL #3   Title Patient to be able to ambulate at least 1266ft during 6 minute walk test with pain no more than 2/10 in order to enhance her ability to perform functional community based tasks with minimal pain related limitations    Time 6   Period Weeks   Status On-going   PT LONG TERM GOAL #4   Time 6   Period Weeks   Status On-going   PT LONG TERM GOAL #5   Title Patient will experience no more than 3/10 pain when ascending and descending stairs with one railing and good control, no valgus moment, in order to expand her abilty to efficiently perform community mobility    Time 6   Period Weeks   Status On-going               Plan - 02/07/15 [id number]    Clinical Impression Statement Treatment focused on correct technique of exercises.  Added vector stances to program.  All exercises were completed with therapist facilitation.   PT Next Visit Plan Continue to progress functional strength.  Continue VMO strengthening and F/U with knee popping, may benefit from kinesiotaping.          Problem List Patient Active Problem List   Diagnosis Date Noted  . Dysphagia, unspecified(787.20) 03/22/2013  . GERD (gastroesophageal reflux disease) 03/12/2013  . Seasonal and perennial allergic rhinitis 03/12/2013  . Generalized anxiety disorder 03/12/2013   . Insomnia secondary to anxiety 03/12/2013  . Folliculitis [id number]  . Hoarseness, chronic 03/12/2013  . OBESITY 06/06/2008   Rayetta Humphrey, PT CLT [phone] 02/07/2015, 10:20 AM  [facility]  [address] [place], [place], [address] Phone: [phone]   Fax:  [phone]  Name: [name] MRN: [id number] Date of Birth: 1980/11/29

## 2015-02-10 ENCOUNTER — Ambulatory Visit (HOSPITAL_COMMUNITY): Payer: BLUE CROSS/BLUE SHIELD | Admitting: Physical Therapy

## 2015-02-12 ENCOUNTER — Ambulatory Visit (HOSPITAL_COMMUNITY): Payer: BLUE CROSS/BLUE SHIELD | Admitting: Physical Therapy

## 2015-02-12 DIAGNOSIS — Z789 Other specified health status: Secondary | ICD-10-CM

## 2015-02-12 DIAGNOSIS — M6281 Muscle weakness (generalized): Secondary | ICD-10-CM

## 2015-02-12 DIAGNOSIS — R262 Difficulty in walking, not elsewhere classified: Secondary | ICD-10-CM

## 2015-02-12 DIAGNOSIS — M25662 Stiffness of left knee, not elsewhere classified: Secondary | ICD-10-CM

## 2015-02-12 DIAGNOSIS — M25561 Pain in right knee: Secondary | ICD-10-CM

## 2015-02-12 DIAGNOSIS — R6889 Other general symptoms and signs: Secondary | ICD-10-CM

## 2015-02-12 DIAGNOSIS — M25562 Pain in left knee: Principal | ICD-10-CM

## 2015-02-12 NOTE — Therapy (Signed)
[place], [place], [address] Phone: [phone]   Fax:  [phone]  Physical Therapy Treatment (Reassessment)  Patient Details  Name: [name] MRN: [id number] Date of Birth: 07-11-1980 Referring Provider: Leandro Reasoner  Encounter Date: 02/12/2015      PT End of Session - 02/12/15 1015    Visit Number 9   Number of Visits 11   Date for PT Re-Evaluation 03/12/15   Authorization Type BCBS    Authorization Time Period 01/14/15 to 03/17/15   PT Start Time 0934   PT Stop Time 1014   PT Time Calculation (min) 40 min   Activity Tolerance Patient tolerated treatment well   Behavior During Therapy [facility] for tasks assessed/performed      Past Medical History  Diagnosis Date  . No pertinent past medical history   . Anxiety     Past Surgical History  Procedure Laterality Date  . Tonsillectomy    . Wisdom tooth extraction    . Laparoscopic assisted vaginal hysterectomy  12/13/2011    Procedure: LAPAROSCOPIC ASSISTED VAGINAL HYSTERECTOMY;  Surgeon: Luz Lex, MD;  Location: [facility];  Service: Gynecology;  Laterality: N/A;  . Abdominal hysterectomy  2013  . Cesarean section  2012    twins  . Esophagogastroduodenoscopy N/A 05/02/2013    Procedure: ESOPHAGOGASTRODUODENOSCOPY (EGD);  Surgeon: Rogene Houston, MD;  [facility];  Service: Endoscopy;  Laterality: N/A;  230    There were no vitals filed for this visit.  Visit Diagnosis:  Knee pain, bilateral  Muscle weakness  Difficulty walking  Difficulty navigating stairs  Knee stiffness, left      Subjective Assessment - 02/12/15 0937    Subjective Patient reports that she fell this past Monday after slipping on ice. She noted that she fell posteriorly and fell onto out stretched arms. Patient reported soreness after her fall, but is feeling better since then.    How long can you stand comfortably? 30 minutes    How long can you walk  comfortably? 20-25 minutes    Currently in Pain? Yes   Pain Score 4    Pain Location Knee   Pain Orientation Distal;Medial;Right;Left            [facility] PT Assessment - 02/12/15 0001    AROM   Right Hip Internal Rotation  27   Left Hip Internal Rotation  29   Right Knee Extension 0   Right Knee Flexion 123   Left Knee Extension 0   Left Knee Flexion 117   Right Ankle Dorsiflexion 16   Left Ankle Dorsiflexion 16   Strength   Right Hip Flexion 5/5   Right Hip Extension 4-/5   Right Hip ABduction 4/5   Left Hip Flexion 5/5   Left Hip Extension 4-/5   Left Hip ABduction 4/5   Right Knee Flexion 4/5   Right Knee Extension 4+/5   Left Knee Flexion 3+/5   Left Knee Extension 4+/5   Right Ankle Dorsiflexion 5/5   Left Ankle Dorsiflexion 4+/5   Transfers   Five time sit to stand comments  12.9 sec   6 minute walk test results    Aerobic Endurance Distance Walked 1299   Endurance additional comments 6MWT; gait speed 1.2 m/s                      [facility] Adult PT Treatment/Exercise - 02/12/15 0001    Knee/Hip  Exercises: Standing   Heel Raises 20 reps   Heel Raises Limitations heel and toe   Forward Lunges Both;15 reps   Forward Lunges Limitations onto 4" step   Lateral Step Up Both;15 reps   Lateral Step Up Limitations 6"   Forward Step Up Both;15 reps   Forward Step Up Limitations 6"                PT Education - 02/12/15 1014    Education provided Yes   Education Details Educated patient on current functional status, proper ther ex technique, and POC   Person(s) Educated Patient   Methods Explanation;Demonstration   Comprehension Verbalized understanding;Returned demonstration;Verbal cues required          PT Short Term Goals - 02/12/15 0958    PT SHORT TERM GOAL #1   Title Patient will increase bilateral hip IR ROM by at least 10 degrees in order to improve overall functional mechanics and reduce stress on bilateral knees   Time 3   Period  Weeks   Status On-going   PT SHORT TERM GOAL #2   Title Patient will demonstrate L knee range of motion 0-120 degrees with pain no more than 3/10 in order to improve overall functional mechanics and overall efficiency of gait/general mobility    Time 3   Period Weeks   Status Partially Met   PT SHORT TERM GOAL #3   Title Patient will experience no more than 3/10 pain at worst in order to allow her to more easily participate in functional home, family, and community based activities with minimal pain related limitations    Time 3   Period Weeks   Status On-going   PT SHORT TERM GOAL #4   Title Patient to be independent in correctly and consistently performing appropriate HEP, to be updated PRN    Time 3   Period Weeks   Status Achieved           PT Long Term Goals - 02/12/15 1000    PT LONG TERM GOAL #1   Title Patient will demonstrate strength of at least 4+/5 in all tested muscle groups in order to enhance functional stability and reduce pain during mobility based tasks    Time 6   Period Weeks   Status On-going   PT LONG TERM GOAL #2   Title Patient to display improved muscle power as evidenced by an ability to complete 5x sit to stand test in 12 seconds or less with pain no more than 2/10 in order to improve functional tasks such as floor to stand and stairs    Time 6   Period Weeks   Status Partially Met   PT LONG TERM GOAL #3   Title Patient to be able to ambulate at least 1232f during 6 minute walk test with pain no more than 2/10 in order to enhance her ability to perform functional community based tasks with minimal pain related limitations    Time 6   Period Weeks   Status Achieved   PT LONG TERM GOAL #4   Title Patient to report that she has been able to get down on the floor to play with her children for a period of at least 20 minutes with pain no more than 3/10 in order to allow her to more easily participate in family based activities    Time 6   Period Weeks    Status On-going   PT LONG TERM GOAL #5  Title Patient will experience no more than 3/10 pain when ascending and descending stairs with one railing and good control, no valgus moment, in order to expand her abilty to efficiently perform community mobility    Time 6   Period Weeks   Status Partially Met               Plan - 02/12/15 1009    Clinical Impression Statement Re-asssessment performed today. Patient shows improvement all around with improved muscle power, improved muscle strength, imiproved gait and stair mechanics, improved gait distance, and improving pain levels and overall function. Also improved knee ROM on L. At this point patient reports that she is feeling very good about her current level of function and would be interested in discharging soon; agreeable to continuting for one more week with focus on advanced HEP and strength.    Pt will benefit from skilled therapeutic intervention in order to improve on the following deficits Abnormal gait;Hypomobility;Decreased activity tolerance;Decreased strength;Pain;Difficulty walking;Improper body mechanics;Decreased range of motion;Impaired flexibility;Postural dysfunction   Rehab Potential Good   Clinical Impairments Affecting Rehab Potential chronic knee pain    PT Frequency 2x / week   PT Duration Other (comment)  1 week    PT Treatment/Interventions ADLs/Self Care Home Management;Cryotherapy;Gait training;Stair training;Functional mobility training;Therapeutic activities;Therapeutic exercise;Balance training;Neuromuscular re-education;Patient/family education;Manual techniques;Taping   PT Next Visit Plan 2 more sessions. Focus on functional strength and develop advanced HEP.    PT Home Exercise Plan given    Consulted and Agree with Plan of Care Patient        Problem List Patient Active Problem List   Diagnosis Date Noted  . Dysphagia, unspecified(787.20) 03/22/2013  . GERD (gastroesophageal reflux disease)  03/12/2013  . Seasonal and perennial allergic rhinitis 03/12/2013  . Generalized anxiety disorder 03/12/2013  . Insomnia secondary to anxiety 03/12/2013  . Folliculitis 38/75/6433  . Hoarseness, chronic 03/12/2013  . OBESITY 06/06/2008   Physical Therapy Progress Note  Dates of Reporting Period: 01/14/2015 to 02/12/2015  Objective Reports of Subjective Statement: See above  Objective Measurements:See above  Goal Update: See above  Plan: See above  Reason Skilled Services are Required: See above Deniece Ree PT, DPT [place], [place], [address] Phone: [phone]   Fax:  [phone]  Name: [name] MRN: [id number] Date of Birth: Dec 23, 1980

## 2015-02-13 ENCOUNTER — Ambulatory Visit (INDEPENDENT_AMBULATORY_CARE_PROVIDER_SITE_OTHER): Payer: Self-pay | Admitting: Otolaryngology

## 2015-02-17 ENCOUNTER — Ambulatory Visit (HOSPITAL_COMMUNITY): Payer: BLUE CROSS/BLUE SHIELD | Admitting: Physical Therapy

## 2015-02-17 DIAGNOSIS — M25562 Pain in left knee: Principal | ICD-10-CM

## 2015-02-17 DIAGNOSIS — M6281 Muscle weakness (generalized): Secondary | ICD-10-CM

## 2015-02-17 DIAGNOSIS — M25561 Pain in right knee: Secondary | ICD-10-CM

## 2015-02-17 DIAGNOSIS — M25662 Stiffness of left knee, not elsewhere classified: Secondary | ICD-10-CM

## 2015-02-17 DIAGNOSIS — Z789 Other specified health status: Secondary | ICD-10-CM

## 2015-02-17 DIAGNOSIS — R6889 Other general symptoms and signs: Secondary | ICD-10-CM

## 2015-02-17 DIAGNOSIS — R262 Difficulty in walking, not elsewhere classified: Secondary | ICD-10-CM

## 2015-02-17 NOTE — Therapy (Signed)
[place], [place], [address] Phone: [phone]   Fax:  [phone]  Physical Therapy Treatment  Patient Details  Name: [name] MRN: [id number] Date of Birth: July 23, 1980 Referring Provider: Leandro Reasoner  Encounter Date: 02/17/2015      PT End of Session - 02/17/15 0842    Visit Number 10   Number of Visits 11   Date for PT Re-Evaluation 03/12/15   Authorization Type BCBS    Authorization Time Period 01/14/15 to 03/17/15   PT Start Time 0801   PT Stop Time 0843   PT Time Calculation (min) 42 min   Activity Tolerance Patient tolerated treatment well   Behavior During Therapy [facility] for tasks assessed/performed      Past Medical History  Diagnosis Date  . No pertinent past medical history   . Anxiety     Past Surgical History  Procedure Laterality Date  . Tonsillectomy    . Wisdom tooth extraction    . Laparoscopic assisted vaginal hysterectomy  12/13/2011    Procedure: LAPAROSCOPIC ASSISTED VAGINAL HYSTERECTOMY;  Surgeon: Luz Lex, MD;  Location: [facility];  Service: Gynecology;  Laterality: N/A;  . Abdominal hysterectomy  2013  . Cesarean section  2012    twins  . Esophagogastroduodenoscopy N/A 05/02/2013    Procedure: ESOPHAGOGASTRODUODENOSCOPY (EGD);  Surgeon: Rogene Houston, MD;  [facility];  Service: Endoscopy;  Laterality: N/A;  230    There were no vitals filed for this visit.  Visit Diagnosis:  Knee pain, bilateral  Muscle weakness  Difficulty walking  Difficulty navigating stairs  Knee stiffness, left      Subjective Assessment - 02/17/15 0803    Subjective Patient reports she had a good weekend-had a birthday party for her children and pain is not doing too bad    Pertinent History Patient reports that she has always had trouble with her knees but over the past year things have just gotten a lot worse. L has always been worse, has been swelling and displayed  painful pops. R knee really started bothering her about 6 months ago with same general symptoms.    Currently in Pain? No/denies                         [facility] Adult PT Treatment/Exercise - 02/17/15 0001    Knee/Hip Exercises: Stretches   Active Hamstring Stretch Both;3 reps;30 seconds   Active Hamstring Stretch Limitations standing on 12" step   Piriformis Stretch Both;30 seconds;2 reps   Piriformis Stretch Limitations seated stretch   Other Knee/Hip Stretches slant board 3X30"   Knee/Hip Exercises: Standing   Heel Raises 20 reps   Heel Raises Limitations heel and toe   Forward Lunges Both;1 set;10 reps   Forward Lunges Limitations 2 inch pad   also onto 4 inch step with legs in ER  1x10   Side Lunges Both;1 set;10 reps   Side Lunges Limitations 2 inch pad    Lateral Step Up Both;1 set   Lateral Step Up Limitations 6 inch step    Forward Step Up Both;15 reps   Forward Step Up Limitations 6 inch step, leg in ER    Step Down Both;1 set;15 reps   Step Down Limitations 4 inch box    SLS with Vectors 5" x 5 B    Other Standing Knee Exercises 3D hip excursions 1x15; 2D hip excursion walks 2x69f; hip ABD  endurance walks 2x72f    Other Standing Knee Exercises sit to stand with legs in ER;                 PT Education - 02/17/15 0842    Education provided Yes   Education Details DC next session    Person(s) Educated Patient   Methods Explanation   Comprehension Verbalized understanding          PT Short Term Goals - 02/12/15 0958    PT SHORT TERM GOAL #1   Title Patient will increase bilateral hip IR ROM by at least 10 degrees in order to improve overall functional mechanics and reduce stress on bilateral knees   Time 3   Period Weeks   Status On-going   PT SHORT TERM GOAL #2   Title Patient will demonstrate L knee range of motion 0-120 degrees with pain no more than 3/10 in order to improve overall functional mechanics and overall efficiency of  gait/general mobility    Time 3   Period Weeks   Status Partially Met   PT SHORT TERM GOAL #3   Title Patient will experience no more than 3/10 pain at worst in order to allow her to more easily participate in functional home, family, and community based activities with minimal pain related limitations    Time 3   Period Weeks   Status On-going   PT SHORT TERM GOAL #4   Title Patient to be independent in correctly and consistently performing appropriate HEP, to be updated PRN    Time 3   Period Weeks   Status Achieved           PT Long Term Goals - 02/12/15 1000    PT LONG TERM GOAL #1   Title Patient will demonstrate strength of at least 4+/5 in all tested muscle groups in order to enhance functional stability and reduce pain during mobility based tasks    Time 6   Period Weeks   Status On-going   PT LONG TERM GOAL #2   Title Patient to display improved muscle power as evidenced by an ability to complete 5x sit to stand test in 12 seconds or less with pain no more than 2/10 in order to improve functional tasks such as floor to stand and stairs    Time 6   Period Weeks   Status Partially Met   PT LONG TERM GOAL #3   Title Patient to be able to ambulate at least 12062fduring 6 minute walk test with pain no more than 2/10 in order to enhance her ability to perform functional community based tasks with minimal pain related limitations    Time 6   Period Weeks   Status Achieved   PT LONG TERM GOAL #4   Title Patient to report that she has been able to get down on the floor to play with her children for a period of at least 20 minutes with pain no more than 3/10 in order to allow her to more easily participate in family based activities    Time 6   Period Weeks   Status On-going   PT LONG TERM GOAL #5   Title Patient will experience no more than 3/10 pain when ascending and descending stairs with one railing and good control, no valgus moment, in order to expand her abilty to  efficiently perform community mobility    Time 6   Period Weeks   Status Partially Met  Plan - 02/17/15 0836    Clinical Impression Statement Perofrmed functional strengthening exercises today with good performance by patient, only occasional cues for form and no increased pain throughout today's session. Recommend focusing on advanced HEP and DC next session as patient reports she is doing fairly well at home and able to perform all exercises/activities well with skilled PT services.    Pt will benefit from skilled therapeutic intervention in order to improve on the following deficits Abnormal gait;Hypomobility;Decreased activity tolerance;Decreased strength;Pain;Difficulty walking;Improper body mechanics;Decreased range of motion;Impaired flexibility;Postural dysfunction   Rehab Potential Good   Clinical Impairments Affecting Rehab Potential chronic knee pain    PT Frequency Other (comment)  1 more session    PT Duration Other (comment)  1 more session    PT Treatment/Interventions ADLs/Self Care Home Management;Cryotherapy;Gait training;Stair training;Functional mobility training;Therapeutic activities;Therapeutic exercise;Balance training;Neuromuscular re-education;Patient/family education;Manual techniques;Taping   PT Next Visit Plan advanced HEP and DC    PT Home Exercise Plan given    Consulted and Agree with Plan of Care Patient        Problem List Patient Active Problem List   Diagnosis Date Noted  . Dysphagia, unspecified(787.20) 03/22/2013  . GERD (gastroesophageal reflux disease) 03/12/2013  . Seasonal and perennial allergic rhinitis 03/12/2013  . Generalized anxiety disorder 03/12/2013  . Insomnia secondary to anxiety 03/12/2013  . Folliculitis 74/01/8785  . Hoarseness, chronic 03/12/2013  . OBESITY 06/06/2008    Deniece Ree PT, DPT [facility] [address] [place], [place],  [address] Phone: [phone]   Fax:  [phone]  Name: [name] MRN: [id number] Date of Birth: 11/01/1980

## 2015-02-19 ENCOUNTER — Ambulatory Visit (HOSPITAL_COMMUNITY): Payer: BLUE CROSS/BLUE SHIELD | Admitting: Physical Therapy

## 2015-02-19 DIAGNOSIS — R6889 Other general symptoms and signs: Secondary | ICD-10-CM

## 2015-02-19 DIAGNOSIS — M25561 Pain in right knee: Secondary | ICD-10-CM

## 2015-02-19 DIAGNOSIS — M25662 Stiffness of left knee, not elsewhere classified: Secondary | ICD-10-CM

## 2015-02-19 DIAGNOSIS — M25562 Pain in left knee: Principal | ICD-10-CM

## 2015-02-19 DIAGNOSIS — Z789 Other specified health status: Secondary | ICD-10-CM

## 2015-02-19 DIAGNOSIS — M6281 Muscle weakness (generalized): Secondary | ICD-10-CM

## 2015-02-19 DIAGNOSIS — R262 Difficulty in walking, not elsewhere classified: Secondary | ICD-10-CM

## 2015-02-19 NOTE — Therapy (Signed)
[facility] [address] [place], [place], [address] Phone: [phone]   Fax:  [phone]  Physical Therapy Treatment (Discharge)  Patient Details  Name: [name] MRN: [id number] Date of Birth: 09-Jan-1981 Referring Provider: Leandro Reasoner  Encounter Date: 02/19/2015      PT End of Session - 02/19/15 0848    Visit Number 11   Number of Visits 11   Authorization Type BC[employer]    Authorization Time Period 01/14/15 to 03/17/15   PT Start Time 0802   PT Stop Time 0842   PT Time Calculation (min) 40 min   Activity Tolerance Patient tolerated treatment well   Behavior During Therapy [facility] for tasks assessed/performed      Past Medical History  Diagnosis Date  . No pertinent past medical history   . Anxiety     Past Surgical History  Procedure Laterality Date  . Tonsillectomy    . Wisdom tooth extraction    . Laparoscopic assisted vaginal hysterectomy  12/13/2011    Procedure: LAPAROSCOPIC ASSISTED VAGINAL HYSTERECTOMY;  Surgeon: Luz Lex, MD;  Location: [facility];  Service: Gynecology;  Laterality: N/A;  . Abdominal hysterectomy  2013  . Cesarean section  2012    twins  . Esophagogastroduodenoscopy N/A 05/02/2013    Procedure: ESOPHAGOGASTRODUODENOSCOPY (EGD);  Surgeon: Rogene Houston, MD;  [facility];  Service: Endoscopy;  Laterality: N/A;  230    There were no vitals filed for this visit.  Visit Diagnosis:  Knee pain, bilateral  Muscle weakness  Difficulty walking  Difficulty navigating stairs  Knee stiffness, left      Subjective Assessment - 02/19/15 0803    Subjective Patient reports that she is doing well today, having mild pain but otherwise doing well    Pertinent History Patient reports that she has always had trouble with her knees but over the past year things have just gotten a lot worse. L has always been worse, has been swelling and displayed painful pops. R knee really started bothering  her about 6 months ago with same general symptoms.    Currently in Pain? Yes   Pain Score 3    Pain Location Knee   Pain Orientation Left                         [facility] Adult PT Treatment/Exercise - 02/19/15 0001    Knee/Hip Exercises: Stretches   Active Hamstring Stretch Both;3 reps;30 seconds   Active Hamstring Stretch Limitations standing on 12" step   Piriformis Stretch Both;30 seconds;2 reps   Piriformis Stretch Limitations seated stretch   Other Knee/Hip Stretches slant board 3X30"   Knee/Hip Exercises: Standing   Heel Raises 20 reps   Heel Raises Limitations heel and toe   Forward Lunges Both;1 set;15 reps   Forward Lunges Limitations 2 inch pad   also 4 inch step with legs in ER 1x15    Side Lunges Both;1 set;15 reps   Side Lunges Limitations 2 inch pad    Step Down Both;1 set;15 reps   Step Down Limitations 4 inch box    SLS with Vectors SLS vectors 1x10 each with 5 second holds    Other Standing Knee Exercises 3D hip excursions 1x15    Other Standing Knee Exercises sit to stand with legs in ER 1x10                PT Education - 02/19/15 0175  Education provided Yes   Education Details DC tdoay; advised to attend gym with personal trainer for past couple sessions; advanced HEP    Person(s) Educated Patient   Methods Explanation   Comprehension Verbalized understanding          PT Short Term Goals - 02/12/15 6834    PT SHORT TERM GOAL #1   Title Patient will increase bilateral hip IR ROM by at least 10 degrees in order to improve overall functional mechanics and reduce stress on bilateral knees   Time 3   Period Weeks   Status On-going   PT SHORT TERM GOAL #2   Title Patient will demonstrate L knee range of motion 0-120 degrees with pain no more than 3/10 in order to improve overall functional mechanics and overall efficiency of gait/general mobility    Time 3   Period Weeks   Status Partially Met   PT SHORT TERM GOAL #3   Title  Patient will experience no more than 3/10 pain at worst in order to allow her to more easily participate in functional home, family, and community based activities with minimal pain related limitations    Time 3   Period Weeks   Status On-going   PT SHORT TERM GOAL #4   Title Patient to be independent in correctly and consistently performing appropriate HEP, to be updated PRN    Time 3   Period Weeks   Status Achieved           PT Long Term Goals - 02/12/15 1000    PT LONG TERM GOAL #1   Title Patient will demonstrate strength of at least 4+/5 in all tested muscle groups in order to enhance functional stability and reduce pain during mobility based tasks    Time 6   Period Weeks   Status On-going   PT LONG TERM GOAL #2   Title Patient to display improved muscle power as evidenced by an ability to complete 5x sit to stand test in 12 seconds or less with pain no more than 2/10 in order to improve functional tasks such as floor to stand and stairs    Time 6   Period Weeks   Status Partially Met   PT LONG TERM GOAL #3   Title Patient to be able to ambulate at least 1269f during 6 minute walk test with pain no more than 2/10 in order to enhance her ability to perform functional community based tasks with minimal pain related limitations    Time 6   Period Weeks   Status Achieved   PT LONG TERM GOAL #4   Title Patient to report that she has been able to get down on the floor to play with her children for a period of at least 20 minutes with pain no more than 3/10 in order to allow her to more easily participate in family based activities    Time 6   Period Weeks   Status On-going   PT LONG TERM GOAL #5   Title Patient will experience no more than 3/10 pain when ascending and descending stairs with one railing and good control, no valgus moment, in order to expand her abilty to efficiently perform community mobility    Time 6   Period Weeks   Status Partially Met                Plan - 02/19/15 0819    Clinical Impression Statement Patient continues to do well overall with functional  exercises with good form and minimal pain today, also continues to state that she feels ready to discharge at this point. Discussed gym membership and patinet reports she has [employer] membership already and is planning to start regular exercise program at this gym after DC from PT; recommended that patient work with trainer for first couple of sessions at gym to establish knowledge of safe use of machines and appropriate personal fitness program. DC with advanced HEP today.    Rehab Potential Good   Clinical Impairments Affecting Rehab Potential chronic knee pain    PT Next Visit Plan DC today    PT Home Exercise Plan given    Consulted and Agree with Plan of Care Patient        Problem List Patient Active Problem List   Diagnosis Date Noted  . Dysphagia, unspecified(787.20) 03/22/2013  . GERD (gastroesophageal reflux disease) 03/12/2013  . Seasonal and perennial allergic rhinitis 03/12/2013  . Generalized anxiety disorder 03/12/2013  . Insomnia secondary to anxiety 03/12/2013  . Folliculitis 16/02/930  . Hoarseness, chronic 03/12/2013  . OBESITY 06/06/2008    PHYSICAL THERAPY DISCHARGE SUMMARY  Visits from Start of Care: 11  Current functional level related to goals / functional outcomes: Patient doing very well overall with minimal pain and able to perform the functional tasks that she is interested in at this time; has plan to start fitness program at gym and feels ready for DC at this point.    Remaining deficits: Knee pain, mild stiffness, some functional weakness    Education / Equipment: Advanced HEP, advised to use personal trainer for first couple of sessions at gym to establish knowledge of machines and good fitness route at gym  Plan: Patient agrees to discharge.  Patient goals were partially met. Patient is being discharged due to being pleased with  the current functional level.  ?????       Deniece Ree PT, DPT [facility] [address] [place], [place], [address] Phone: [phone]   Fax:  [phone]  Name: [name] MRN: [id number] Date of Birth: 01-27-1981

## 2015-02-19 NOTE — Patient Instructions (Addendum)
   BRIDGING  While lying on your back, tighten your lower abdominals, squeeze your buttocks and then raise your buttocks off the floor/bed as creating a "Bridge" with your body.  Repeat 15 times with 1-2 second holds, twice a day.    HIP ABDUCTION - SIDELYING  While lying on your side, slowly raise up your top leg to the side. Keep your knee straight and maintain your toes pointed forward the entire time. Hold for 1-2 seconds at the top; make sure you are leading with your heel, not your toes.   The bottom leg can be bent to stabilize your body.  Repeat 10-15 times, twice a day.   Knee Extension: Sit to Stand (Eccentric)    Stand close to chair. Slowly lower self to seated position. _10__ reps per set, _2__ sets per day, _5-7__ days per week. Progress to stopping midway before lowering to chair. Progress to barely touching chair.  Copyright  VHI. All rights reserved.       Gastroc Stretch  Place foot against wall. Keep knee straight, but not locked. Lean forward until you feel a comfortable stretch. Hold for 30 seconds.  Repeat twice each side, twice a day.    PRONE HAMSTRING CURLS  While lying face down, slowly bend your knee as you bring your foot towards your buttock. Hold for 1-2 seconds.  Repeat 10-15 times, twice a day.

## 2015-02-24 ENCOUNTER — Encounter (HOSPITAL_COMMUNITY): Payer: Self-pay | Admitting: Physical Therapy

## 2015-02-26 ENCOUNTER — Encounter (HOSPITAL_COMMUNITY): Payer: Self-pay

## 2015-03-03 ENCOUNTER — Encounter (HOSPITAL_COMMUNITY): Payer: Self-pay | Admitting: Physical Therapy

## 2015-03-31 ENCOUNTER — Ambulatory Visit (INDEPENDENT_AMBULATORY_CARE_PROVIDER_SITE_OTHER): Payer: BLUE CROSS/BLUE SHIELD

## 2015-03-31 DIAGNOSIS — Z111 Encounter for screening for respiratory tuberculosis: Secondary | ICD-10-CM

## 2015-04-02 LAB — TB SKIN TEST
Induration: 0 mm
TB Skin Test: NEGATIVE

## 2015-07-21 ENCOUNTER — Encounter: Payer: Self-pay | Admitting: Family Medicine

## 2015-07-21 ENCOUNTER — Ambulatory Visit (INDEPENDENT_AMBULATORY_CARE_PROVIDER_SITE_OTHER): Payer: BLUE CROSS/BLUE SHIELD | Admitting: Family Medicine

## 2015-07-21 ENCOUNTER — Other Ambulatory Visit (HOSPITAL_COMMUNITY)
Admission: RE | Admit: 2015-07-21 | Discharge: 2015-07-21 | Disposition: A | Discharge status: Home / Self Care | Payer: BLUE CROSS/BLUE SHIELD | Source: Ambulatory Visit | Attending: Family Medicine | Admitting: Family Medicine

## 2015-07-21 VITALS — BP 112/80 | HR 65 | Resp 16 | Ht 66.0 in | Wt 209.0 lb

## 2015-07-21 DIAGNOSIS — Z124 Encounter for screening for malignant neoplasm of cervix: Secondary | ICD-10-CM | POA: Diagnosis not present

## 2015-07-21 DIAGNOSIS — Z1151 Encounter for screening for human papillomavirus (HPV): Secondary | ICD-10-CM | POA: Insufficient documentation

## 2015-07-21 DIAGNOSIS — E669 Obesity, unspecified: Secondary | ICD-10-CM | POA: Diagnosis not present

## 2015-07-21 DIAGNOSIS — Z01419 Encounter for gynecological examination (general) (routine) without abnormal findings: Secondary | ICD-10-CM | POA: Insufficient documentation

## 2015-07-21 DIAGNOSIS — Z0000 Encounter for general adult medical examination without abnormal findings: Secondary | ICD-10-CM | POA: Diagnosis not present

## 2015-07-21 DIAGNOSIS — R0683 Snoring: Secondary | ICD-10-CM | POA: Diagnosis not present

## 2015-07-21 LAB — CBC
HCT: 40.2 % (ref 35.0–45.0)
Hemoglobin: 13.3 g/dL (ref 11.7–15.5)
MCH: 29.3 pg (ref 27.0–33.0)
MCHC: 33.1 g/dL (ref 32.0–36.0)
MCV: 88.5 fL (ref 80.0–100.0)
MPV: 9.4 fL (ref 7.5–12.5)
Platelets: 273 10*3/uL (ref 140–400)
RBC: 4.54 MIL/uL (ref 3.80–5.10)
RDW: 14.1 % (ref 11.0–15.0)
WBC: 4.4 10*3/uL (ref 3.8–10.8)

## 2015-07-21 LAB — COMPLETE METABOLIC PANEL WITH GFR
ALT: 11 U/L (ref 6–29)
AST: 28 U/L (ref 10–30)
Albumin: 4.1 g/dL (ref 3.6–5.1)
Alkaline Phosphatase: 40 U/L (ref 33–115)
BUN: 9 mg/dL (ref 7–25)
CO2: 23 mmol/L (ref 20–31)
Calcium: 9.2 mg/dL (ref 8.6–10.2)
Chloride: 104 mmol/L (ref 98–110)
Creat: 0.83 mg/dL (ref 0.50–1.10)
GFR, Est African American: >89 mL/min (ref >=60)
GFR, Est Non African American: >89 mL/min (ref >=60)
Glucose, Bld: 80 mg/dL (ref 65–99)
Potassium: 4.1 mmol/L (ref 3.5–5.3)
Sodium: 139 mmol/L (ref 135–146)
Total Bilirubin: 0.7 mg/dL (ref 0.2–1.2)
Total Protein: 6.6 g/dL (ref 6.1–8.1)

## 2015-07-21 LAB — HEMOGLOBIN A1C
Hgb A1c MFr Bld: 5.2 % (ref <5.7)
Mean Plasma Glucose: 103 mg/dL

## 2015-07-21 LAB — LIPID PANEL
Cholesterol: 143 mg/dL (ref 125–200)
HDL: 57 mg/dL (ref >=46)
LDL Cholesterol: 78 mg/dL (ref <130)
Total CHOL/HDL Ratio: 2.5 Ratio (ref <=5.0)
Triglycerides: 41 mg/dL (ref <150)
VLDL: 8 mg/dL (ref <30)

## 2015-07-21 LAB — TSH: TSH: 0.67 mIU/L

## 2015-07-21 MED ORDER — AZELASTINE HCL 0.1 % NA SOLN: 2.0000 | Freq: Two times a day (BID) | NASAL | Status: DC

## 2015-07-21 MED ORDER — PHENTERMINE HCL 37.5 MG PO TABS: 37.5000 mg | ORAL_TABLET | Freq: Every day | ORAL | Status: DC

## 2015-07-21 NOTE — Assessment & Plan Note (Deleted)
Excessive snoring, awakens patient from her sleep, chronic fatigue, refer for eval for sleep apnea

## 2015-07-21 NOTE — Assessment & Plan Note (Signed)
Deteriorated. Patient re-educated about  the importance of commitment to a  minimum of 150 minutes of exercise per week.  The importance of healthy food choices with portion control discussed. Encouraged to start a food diary, count calories and to consider  joining a support group. Sample diet sheets offered. Goals set by the patient for the next several months.   Weight /BMI 07/21/2015 06/11/2013 05/28/2013  WEIGHT 209 lb 202 lb 196 lb  HEIGHT 5\' 6"  5\' 6"  5\' 6"   BMI 33.75 kg/m2 32.62 kg/m2 31.65 kg/m2    Current exercise per week 60 minutes.Strat half phentermine

## 2015-07-21 NOTE — Patient Instructions (Signed)
F/u in 4 month, call if you need me sooner  Lab results will be sent to you on my chart  Start half phentermine daily, work on food choice and regular eating  Weight loss goal of 12  To 15 pounds  New additional nasal spray is astelin for improved control of allergies  You are referred to Dr Merlene Laughter for snoring evaluation  Pls commit to 30 mins of exercise every day.  Thank you  for choosing [facility]. We consider it a privelige to serve you.  Delivering excellent health care in a caring and  compassionate way is our goal.  Partnering with you,  so that together we can achieve this goal is our strategy.

## 2015-07-21 NOTE — Assessment & Plan Note (Signed)
Excessive snoring with chronic fatigue refer for eval of sleep apnea

## 2015-07-21 NOTE — Progress Notes (Signed)
   Subjective:    Patient ID: [name], female    DOB: 23-Sep-1980, 35 y.o.   MRN: [id number]  HPI Patient is in for annual physical exam. C/o weight gain and difficulty/bility to lose weight , wants help Recent labs, if available are reviewed. Immunization is reviewed , and  updated if needed. C/o chronic fatigue and excessive snoring, needs further eval   Review of Systems See HPI      Objective:   Physical Exam BP 112/80 mmHg  Pulse 65  Resp 16  Ht 5\' 6"  (1.676 m)  Wt 209 lb (94.802 kg)  BMI 33.75 kg/m2  SpO2 98%  LMP 11/21/2011 Pleasant well nourished female, alert and oriented x 3, in no cardio-pulmonary distress. Afebrile. HEENT No facial trauma or asymetry. Sinuses non tender.  Extra occullar muscles intact, pupils equally reactive to light. External ears normal, tympanic membranes clear. Oropharynx moist, no exudate, good dentition. Neck: supple, no adenopathy,JVD or thyromegaly.No bruits.  Chest: Clear to ascultation bilaterally.No crackles or wheezes. Non tender to palpation  Breast: No asymetry,no masses or lumps. No tenderness. No nipple discharge or inversion. No axillary or supraclavicular adenopathy  Cardiovascular system; Heart sounds normal,  S1 and  S2 ,no S3.  No murmur, or thrill. Apical beat not displaced Peripheral pulses normal.  Abdomen: Soft, non tender, no organomegaly or masses. No bruits. Bowel sounds normal. No guarding, tenderness or rebound.  Rectal:  Normal sphincter tone. No mass.No rectal masses.  Guaiac negative stool.  GU: External genitalia normal female genitalia , female distribution of hair. No lesions. Urethral meatus normal in size, no  Prolapse, no lesions visibly  Present. Bladder non tender. Vagina pink and moist , with no visible lesions , fishy  discharge present . Adequate pelvic support no  cystocele or rectocele noted  Uterus absent, no adnexal masses or tenderness.   Musculoskeletal exam: Full  ROM of spine, hips , shoulders and knees. No deformity ,swelling or crepitus noted. No muscle wasting or atrophy.   Neurologic: Cranial nerves 2 to 12 intact. Power, tone ,sensation and reflexes normal throughout. No disturbance in gait. No tremor.  Skin: Intact, no ulceration, erythema , scaling or rash noted. Pigmentation normal throughout  Psych; Normal mood and affect. Judgement and concentration normal        Assessment & Plan:  Annual physical exam Annual exam as documented. Counseling done  re healthy lifestyle involving commitment to 150 minutes exercise per week, heart healthy diet, and attaining healthy weight.The importance of adequate sleep also discussed. Regular seat belt use and home safety, is also discussed. Changes in health habits are decided on by the patient with goals and time frames  set for achieving them. Immunization and cancer screening needs are specifically addressed at this visit. '  OBESITY Deteriorated. Patient re-educated about  the importance of commitment to a  minimum of 150 minutes of exercise per week.  The importance of healthy food choices with portion control discussed. Encouraged to start a food diary, count calories and to consider  joining a support group. Sample diet sheets offered. Goals set by the patient for the next several months.   Weight /BMI 07/21/2015 06/11/2013 05/28/2013  WEIGHT 209 lb 202 lb 196 lb  HEIGHT 5\' 6"  5\' 6"  5\' 6"   BMI 33.75 kg/m2 32.62 kg/m2 31.65 kg/m2    Current exercise per week 60 minutes.Strat half phentermine   Snoring Excessive snoring with chronic fatigue refer for eval of sleep apnea

## 2015-07-21 NOTE — Assessment & Plan Note (Signed)

## 2015-07-22 LAB — VITAMIN D 25 HYDROXY (VIT D DEFICIENCY, FRACTURES): Vit D, 25-Hydroxy: 27 ng/mL — ABNORMAL LOW (ref 30–100)

## 2015-07-22 LAB — CYTOLOGY - PAP

## 2015-08-19 DIAGNOSIS — G4733 Obstructive sleep apnea (adult) (pediatric): Secondary | ICD-10-CM | POA: Diagnosis not present

## 2015-08-19 DIAGNOSIS — G4709 Other insomnia: Secondary | ICD-10-CM | POA: Diagnosis not present

## 2015-08-19 DIAGNOSIS — G43701 Chronic migraine without aura, not intractable, with status migrainosus: Secondary | ICD-10-CM | POA: Diagnosis not present

## 2015-08-19 DIAGNOSIS — R5383 Other fatigue: Secondary | ICD-10-CM | POA: Diagnosis not present

## 2015-10-17 ENCOUNTER — Ambulatory Visit (INDEPENDENT_AMBULATORY_CARE_PROVIDER_SITE_OTHER): Payer: BLUE CROSS/BLUE SHIELD | Admitting: Pediatrics

## 2015-10-17 DIAGNOSIS — Z23 Encounter for immunization: Secondary | ICD-10-CM | POA: Diagnosis not present

## 2015-10-17 NOTE — Progress Notes (Signed)
Here for flu shot only.  Evern Core, MD

## 2015-11-17 ENCOUNTER — Encounter: Payer: Self-pay | Admitting: Family Medicine

## 2015-11-17 ENCOUNTER — Ambulatory Visit (INDEPENDENT_AMBULATORY_CARE_PROVIDER_SITE_OTHER): Payer: BLUE CROSS/BLUE SHIELD | Admitting: Family Medicine

## 2015-11-17 VITALS — BP 122/82 | HR 69 | Ht 66.0 in | Wt 198.8 lb

## 2015-11-17 DIAGNOSIS — J3089 Other allergic rhinitis: Secondary | ICD-10-CM

## 2015-11-17 DIAGNOSIS — J302 Other seasonal allergic rhinitis: Secondary | ICD-10-CM

## 2015-11-17 DIAGNOSIS — K219 Gastro-esophageal reflux disease without esophagitis: Secondary | ICD-10-CM

## 2015-11-17 DIAGNOSIS — E669 Obesity, unspecified: Secondary | ICD-10-CM | POA: Diagnosis not present

## 2015-11-17 MED ORDER — PHENTERMINE HCL 37.5 MG PO TABS: 37.5000 mg | ORAL_TABLET | Freq: Every day | ORAL | 1 refills | Status: DC

## 2015-11-17 NOTE — Assessment & Plan Note (Signed)
Improved with dietary change and weight loss though intermittent hoarseness persists

## 2015-11-17 NOTE — Assessment & Plan Note (Signed)
Controlled, no change in medication  

## 2015-11-17 NOTE — Patient Instructions (Signed)
F/u in 4 months, call if you need me before   Congrats on weight loss, keep it up  No med change  Ten pounds more, you CAN do this  Thank you  for choosing [facility]. We consider it a privelige to serve you.  Delivering excellent health care in a caring and  compassionate way is our goal.  Partnering with you,  so that together we can achieve this goal is our strategy.

## 2015-11-17 NOTE — Progress Notes (Signed)
   JACIE VANWERT     MRN: [id number]      DOB: 29-Apr-1980   HPI Ms. Simson is here for follow up and re-evaluation of chronic medical conditions, medication management and review of any available recent lab and radiology data.  Preventive health is updated, specifically  Cancer screening and Immunization.   Questions or concerns regarding consultations or procedures which the PT has had in the interim are  Addressed.Still has intermittent hoarseness of varying severity The PT denies any adverse reactions to current medications since the last visit.  No adverse s/e from phentermine , committed to regular walking at least 4 days per week for 30 mins, and is changing eating habitss   ROS Denies recent fever or chills. Denies sinus pressure, nasal congestion, ear pain or sore throat. Denies chest congestion, productive cough or wheezing. Denies chest pains, palpitations and leg swelling Denies abdominal pain, nausea, vomiting,diarrhea or constipation.   Denies dysuria, frequency, hesitancy or incontinence. Denies joint pain, swelling and limitation in mobility. Denies headaches, seizures, numbness, or tingling. Denies depression, anxiety or insomnia. Denies skin break down or rash.   PE  BP 122/82   Pulse 69   Ht 5\' 6"  (1.676 m)   Wt 198 lb 12.8 oz (90.2 kg)   LMP 11/21/2011   SpO2 100%   BMI 32.09 kg/m   Patient alert and oriented and in no cardiopulmonary distress.  HEENT: No facial asymmetry, EOMI,   oropharynx pink and moist.  Neck supple no JVD, no mass.  Chest: Clear to auscultation bilaterally.  CVS: S1, S2 no murmurs, no S3.Regular rate.  ABD: Soft non tender.   Ext: No edema  MS: Adequate ROM spine, shoulders, hips and knees.  Skin: Intact, no ulcerations or rash noted.  Psych: Good eye contact, normal affect. Memory intact not anxious or depressed appearing.  CNS: CN 2-12 intact, power,  normal throughout.no focal deficits noted.   Assessment &  Plan  Obesity Improved. Pt applauded on succesful weight loss through lifestyle change, and encouraged to continue same. Weight loss goal set for the next several months.   GERD (gastroesophageal reflux disease) Improved with dietary change and weight loss though intermittent hoarseness persists  Seasonal and perennial allergic rhinitis Controlled, no change in medication

## 2015-11-17 NOTE — Assessment & Plan Note (Signed)
Improved. Pt applauded on succesful weight loss through lifestyle change, and encouraged to continue same. Weight loss goal set for the next several months.  

## 2016-02-06 ENCOUNTER — Other Ambulatory Visit: Payer: Self-pay | Admitting: Family Medicine

## 2016-02-09 ENCOUNTER — Other Ambulatory Visit: Payer: Self-pay

## 2016-02-09 MED ORDER — PHENTERMINE HCL 37.5 MG PO TABS: 37.5000 mg | ORAL_TABLET | Freq: Every day | ORAL | 0 refills | Status: DC

## 2016-02-26 ENCOUNTER — Ambulatory Visit (INDEPENDENT_AMBULATORY_CARE_PROVIDER_SITE_OTHER): Payer: BLUE CROSS/BLUE SHIELD | Admitting: Otolaryngology

## 2016-02-26 DIAGNOSIS — R490 Dysphonia: Secondary | ICD-10-CM | POA: Diagnosis not present

## 2016-02-26 DIAGNOSIS — K219 Gastro-esophageal reflux disease without esophagitis: Secondary | ICD-10-CM

## 2016-03-17 DIAGNOSIS — J383 Other diseases of vocal cords: Secondary | ICD-10-CM | POA: Diagnosis not present

## 2016-03-17 DIAGNOSIS — J385 Laryngeal spasm: Secondary | ICD-10-CM | POA: Diagnosis not present

## 2016-03-17 DIAGNOSIS — R6884 Jaw pain: Secondary | ICD-10-CM | POA: Diagnosis not present

## 2016-03-17 DIAGNOSIS — R259 Unspecified abnormal involuntary movements: Secondary | ICD-10-CM | POA: Diagnosis not present

## 2016-03-17 DIAGNOSIS — G244 Idiopathic orofacial dystonia: Secondary | ICD-10-CM | POA: Diagnosis not present

## 2016-03-17 DIAGNOSIS — J382 Nodules of vocal cords: Secondary | ICD-10-CM | POA: Diagnosis not present

## 2016-03-17 DIAGNOSIS — R490 Dysphonia: Secondary | ICD-10-CM | POA: Diagnosis not present

## 2016-03-23 ENCOUNTER — Ambulatory Visit: Payer: Self-pay | Admitting: Family Medicine

## 2016-03-31 ENCOUNTER — Encounter: Payer: Self-pay | Admitting: Family Medicine

## 2016-03-31 ENCOUNTER — Ambulatory Visit (INDEPENDENT_AMBULATORY_CARE_PROVIDER_SITE_OTHER): Payer: BLUE CROSS/BLUE SHIELD | Admitting: Family Medicine

## 2016-03-31 VITALS — BP 118/62 | HR 68 | Resp 18 | Ht 66.0 in | Wt 184.0 lb

## 2016-03-31 DIAGNOSIS — E663 Overweight: Secondary | ICD-10-CM

## 2016-03-31 DIAGNOSIS — J3089 Other allergic rhinitis: Secondary | ICD-10-CM

## 2016-03-31 DIAGNOSIS — R490 Dysphonia: Secondary | ICD-10-CM

## 2016-03-31 DIAGNOSIS — J302 Other seasonal allergic rhinitis: Secondary | ICD-10-CM

## 2016-03-31 MED ORDER — PHENTERMINE HCL 37.5 MG PO TABS: 37.5000 mg | ORAL_TABLET | Freq: Every day | ORAL | 0 refills | Status: DC

## 2016-03-31 NOTE — Patient Instructions (Addendum)
Preventive visit/ CPE end June/ early July   CONGRATS on weight lo0ss , continue healthy habits    Reduce phentermine to half tablet three times weekly with a plan on quitting in next  4 month, you are prescribed 30 tabs to last for next 4 month  Continus Vit D3 1000 IU one daily  CBc, fasting luipid, cmp, TSh and vit  D 1 week before next visit  OTC loratidine 10 mg one daily for allergies and saline nasal flushes 1 to 2 times daily   It is important that you exercise regularly at least 30 minutes 5 times a week. If you develop chest pain, have severe difficulty breathing, or feel very tired, stop exercising immediately and seek medical attention  Please work on good  health habits so that your health will improve. 1. Commitment to daily physical activity for 30 to 60  minutes, if you are able to do this.  2. Commitment to wise food choices. Aim for half of your  food intake to be vegetable and fruit, one quarter starchy foods, and one quarter protein. Try to eat on a regular schedule  3 meals per day, snacking between meals should be limited to vegetables or fruits or small portions of nuts. 64 ounces of water per day is generally recommended, unless you have specific health conditions, like heart failure or kidney failure where you will need to limit fluid intake.  3. Commitment to sufficient and a  good quality of physical and mental rest daily, generally between 6 to 8 hours per day.  WITH PERSISTANCE AND PERSEVERANCE, THE IMPOSSIBLE , BECOMES THE NORM!

## 2016-03-31 NOTE — Assessment & Plan Note (Signed)
Marked improvement, taper off phentermine in next 4 months Patient re-educated about  the importance of commitment to a  minimum of 150 minutes of exercise per week.  The importance of healthy food choices with portion control discussed. Encouraged to start a food diary, count calories and to consider  joining a support group. Sample diet sheets offered. Goals set by the patient for the next several months.   Weight /BMI 03/31/2016 11/17/2015 07/21/2015  WEIGHT 184 lb 0.6 oz 198 lb 12.8 oz 209 lb  HEIGHT 5\' 6"  5\' 6"  5\' 6"   BMI 29.7 kg/m2 32.09 kg/m2 33.75 kg/m2

## 2016-03-31 NOTE — Assessment & Plan Note (Signed)
Increased symptoms in past 2 weeks, start daily loratidine and saline nasal flushes

## 2016-03-31 NOTE — Progress Notes (Signed)
   [name]     MRN: [id number]      DOB: 03-04-1980   HPI [name] is here for follow up and re-evaluation of chronic medical conditions, medication management and review of any available recent lab and radiology data.  Preventive health is updated, specifically  Cancer screening and Immunization.   Questions or concerns regarding consultations or procedures which the PT has had in the interim are  addressed. The PT denies any adverse reactions to current medications since the last visit.  There are no new concerns.  There are no specific complaints   ROS Denies recent fever or chills. Denies sinus pressure, nasal congestion, ear pain or sore throat. Denies chest congestion, productive cough or wheezing. Denies chest pains, palpitations and leg swelling Denies abdominal pain, nausea, vomiting,diarrhea or constipation.   Denies dysuria, frequency, hesitancy or incontinence. Denies joint pain, swelling and limitation in mobility. Denies headaches, seizures, numbness, or tingling. Denies depression, anxiety or insomnia. Denies skin break down or rash.   PE  BP 118/62   Pulse 68   Resp 18   Ht 5\' 6"  (1.676 m)   Wt 184 lb 0.6 oz (83.5 kg)   LMP 11/21/2011   SpO2 98%   BMI 29.70 kg/m   Patient alert and oriented and in no cardiopulmonary distress.  HEENT: No facial asymmetry, EOMI,   oropharynx pink and moist.  Neck supple no JVD, no mass.  Chest: Clear to auscultation bilaterally.  CVS: S1, S2 no murmurs, no S3.Regular rate.  ABD: Soft non tender.   Ext: No edema  MS: Adequate ROM spine, shoulders, hips and knees.  Skin: Intact, no ulcerations or rash noted.  Psych: Good eye contact, normal affect. Memory intact not anxious or depressed appearing.  CNS: CN 2-12 intact, power,  normal throughout.no focal deficits noted.   Assessment & Plan  Overweight (BMI 25.0-29.9) Marked improvement, taper off phentermine in next 4 months Patient re-educated about  the  importance of commitment to a  minimum of 150 minutes of exercise per week.  The importance of healthy food choices with portion control discussed. Encouraged to start a food diary, count calories and to consider  joining a support group. Sample diet sheets offered. Goals set by the patient for the next several months.   Weight /BMI 03/31/2016 11/17/2015 07/21/2015  WEIGHT 184 lb 0.6 oz 198 lb 12.8 oz 209 lb  HEIGHT 5\' 6"  5\' 6"  5\' 6"   BMI 29.7 kg/m2 32.09 kg/m2 33.75 kg/m2      Hoarseness, chronic Will start voice rehab in next several weeks at [facility], has nodules  Seasonal and perennial allergic rhinitis Increased symptoms in past 2 weeks, start daily loratidine and saline nasal flushes

## 2016-03-31 NOTE — Assessment & Plan Note (Signed)
Will start voice rehab in next several weeks at [facility], has nodules

## 2016-04-01 DIAGNOSIS — J385 Laryngeal spasm: Secondary | ICD-10-CM | POA: Diagnosis not present

## 2016-04-01 DIAGNOSIS — J382 Nodules of vocal cords: Secondary | ICD-10-CM | POA: Diagnosis not present

## 2016-04-01 DIAGNOSIS — R490 Dysphonia: Secondary | ICD-10-CM | POA: Diagnosis not present

## 2016-04-15 DIAGNOSIS — J382 Nodules of vocal cords: Secondary | ICD-10-CM | POA: Diagnosis not present

## 2016-04-15 DIAGNOSIS — R490 Dysphonia: Secondary | ICD-10-CM | POA: Diagnosis not present

## 2016-04-15 DIAGNOSIS — J385 Laryngeal spasm: Secondary | ICD-10-CM | POA: Diagnosis not present

## 2016-04-29 DIAGNOSIS — R490 Dysphonia: Secondary | ICD-10-CM | POA: Diagnosis not present

## 2016-04-29 DIAGNOSIS — J382 Nodules of vocal cords: Secondary | ICD-10-CM | POA: Diagnosis not present

## 2016-04-29 DIAGNOSIS — J385 Laryngeal spasm: Secondary | ICD-10-CM | POA: Diagnosis not present

## 2016-05-31 DIAGNOSIS — H02519 Abnormal innervation syndrome unspecified eye, unspecified eyelid: Secondary | ICD-10-CM | POA: Diagnosis not present

## 2016-05-31 DIAGNOSIS — J385 Laryngeal spasm: Secondary | ICD-10-CM | POA: Diagnosis not present

## 2016-05-31 DIAGNOSIS — J382 Nodules of vocal cords: Secondary | ICD-10-CM | POA: Diagnosis not present

## 2016-05-31 DIAGNOSIS — R490 Dysphonia: Secondary | ICD-10-CM | POA: Diagnosis not present

## 2016-07-29 ENCOUNTER — Encounter: Payer: Self-pay | Admitting: Family Medicine

## 2018-01-31 ENCOUNTER — Encounter: Payer: Self-pay | Admitting: Family Medicine

## 2018-01-31 ENCOUNTER — Ambulatory Visit (INDEPENDENT_AMBULATORY_CARE_PROVIDER_SITE_OTHER): Payer: BLUE CROSS/BLUE SHIELD | Admitting: Family Medicine

## 2018-01-31 VITALS — BP 114/64 | HR 94 | Resp 14 | Ht 66.0 in | Wt 199.1 lb

## 2018-01-31 DIAGNOSIS — E669 Obesity, unspecified: Secondary | ICD-10-CM

## 2018-01-31 DIAGNOSIS — R600 Localized edema: Secondary | ICD-10-CM

## 2018-01-31 DIAGNOSIS — Z23 Encounter for immunization: Secondary | ICD-10-CM | POA: Diagnosis not present

## 2018-01-31 DIAGNOSIS — Z0000 Encounter for general adult medical examination without abnormal findings: Secondary | ICD-10-CM

## 2018-01-31 DIAGNOSIS — Z13220 Encounter for screening for lipoid disorders: Secondary | ICD-10-CM

## 2018-01-31 MED ORDER — PHENTERMINE HCL 37.5 MG PO TABS: 37.5000 mg | ORAL_TABLET | Freq: Every day | ORAL | 1 refills | Status: DC

## 2018-01-31 NOTE — Patient Instructions (Addendum)
F/Un in 4 months, call if you need me before  Flu vaccine today  CBC, lipid, cmp and EGFR and TSH today  It is important that you exercise regularly at least 30 minutes 5 times a week. If you develop chest pain, have severe difficulty breathing, or feel very tired, stop exercising immediately and seek medical attention    Change to MINDFUL , PLANT based eating 75%, cut out bread and avoid sugar except in fruit  Water 64 oz per day  Stop eating at 7  HALF phentermine daily  AVOI D processed foods  1500 cal diet sheet provided, aim 1200 to 1500 cals / day

## 2018-02-02 ENCOUNTER — Encounter: Payer: Self-pay | Admitting: *Deleted

## 2018-02-02 DIAGNOSIS — Z13220 Encounter for screening for lipoid disorders: Secondary | ICD-10-CM | POA: Diagnosis not present

## 2018-02-02 DIAGNOSIS — R600 Localized edema: Secondary | ICD-10-CM | POA: Diagnosis not present

## 2018-02-02 LAB — COMPLETE METABOLIC PANEL WITH GFR
AG Ratio: 1.6 (calc) (ref 1.0–2.5)
ALT: 10 U/L (ref 6–29)
AST: 26 U/L (ref 10–30)
Albumin: 4.2 g/dL (ref 3.6–5.1)
Alkaline phosphatase (APISO): 47 U/L (ref 33–115)
BUN: 10 mg/dL (ref 7–25)
CO2: 27 mmol/L (ref 20–32)
Calcium: 9.4 mg/dL (ref 8.6–10.2)
Chloride: 103 mmol/L (ref 98–110)
Creat: 0.75 mg/dL (ref 0.50–1.10)
GFR, Est African American: 117 mL/min/{1.73_m2} (ref >=60)
GFR, Est Non African American: 101 mL/min/{1.73_m2} (ref >=60)
Globulin: 2.7 g/dL (calc) (ref 1.9–3.7)
Glucose, Bld: 81 mg/dL (ref 65–99)
Potassium: 4.1 mmol/L (ref 3.5–5.3)
Sodium: 136 mmol/L (ref 135–146)
Total Bilirubin: 0.5 mg/dL (ref 0.2–1.2)
Total Protein: 6.9 g/dL (ref 6.1–8.1)

## 2018-02-02 LAB — CBC
HCT: 40.9 % (ref 35.0–45.0)
Hemoglobin: 13.8 g/dL (ref 11.7–15.5)
MCH: 30.8 pg (ref 27.0–33.0)
MCHC: 33.7 g/dL (ref 32.0–36.0)
MCV: 91.3 fL (ref 80.0–100.0)
MPV: 9.9 fL (ref 7.5–12.5)
Platelets: 247 10*3/uL (ref 140–400)
RBC: 4.48 10*6/uL (ref 3.80–5.10)
RDW: 12.1 % (ref 11.0–15.0)
WBC: 4 10*3/uL (ref 3.8–10.8)

## 2018-02-02 LAB — LIPID PANEL
Cholesterol: 164 mg/dL (ref <200)
HDL: 68 mg/dL (ref >50)
LDL Cholesterol (Calc): 82 mg/dL (calc)
Non-HDL Cholesterol (Calc): 96 mg/dL (calc) (ref <130)
Total CHOL/HDL Ratio: 2.4 (calc) (ref <5.0)
Triglycerides: 59 mg/dL (ref <150)

## 2018-02-02 LAB — TSH: TSH: 0.7 mIU/L

## 2018-02-03 ENCOUNTER — Encounter: Payer: Self-pay | Admitting: Family Medicine

## 2018-02-04 ENCOUNTER — Encounter: Payer: Self-pay | Admitting: Family Medicine

## 2018-02-04 NOTE — Assessment & Plan Note (Signed)
Annual exam as documented. Counseling done  re healthy lifestyle involving commitment to 150 minutes exercise per week, heart healthy diet, and attaining healthy weight.The importance of adequate sleep also discussed. Changes in health habits are decided on by the patient with goals and time frames  set for achieving them. Immunization and cancer screening needs are specifically addressed at this visit. 

## 2018-02-04 NOTE — Progress Notes (Signed)
    [name]     MRN: [id number]      DOB: [birth year]  HPI: Patient is in for annual physical exam. Immunization is reviewed , and  Updated.c/o weight gain, has not been exercising as in the past and is having back and hip pain also notes some intermittent leg swelling after standing for a while, has full ROM of joints and attributes her pain to the excess weight, wants to hold on imaging  PE: BP 114/64 (BP Location: Right Arm, Patient Position: Sitting, Cuff Size: Large)   Pulse 94   Resp 14   Ht 5\' 6"  (1.676 m)   Wt 199 lb 1.9 oz (90.3 kg)   LMP 11/21/2011   SpO2 97% Comment: room air  BMI 32.14 kg/m   Pleasant  female, alert and oriented x 3, in no cardio-pulmonary distress. Afebrile. HEENT No facial trauma or asymetry. Sinuses non tender.  Extra occullar muscles intact, pupils equally reactive to light. External ears normal, tympanic membranes clear. Oropharynx moist, no exudate. Neck: supple, no adenopathy,JVD or thyromegaly.No bruits.  Chest: Clear to ascultation bilaterally.No crackles or wheezes. Non tender to palpation  Breast: No asymetry,no masses or lumps. No tenderness. No nipple discharge or inversion. No axillary or supraclavicular adenopathy  Cardiovascular system; Heart sounds normal,  S1 and  S2 ,no S3.  No murmur, or thrill. Apical beat not displaced Peripheral pulses normal.  Abdomen: Soft, non tender, no organomegaly or masses. No bruits. Bowel sounds normal. No guarding, tenderness or rebound.    Musculoskeletal exam: Full ROM of spine, hips , shoulders and knees. No deformity ,swelling or crepitus noted. No muscle wasting or atrophy.   Neurologic: Cranial nerves 2 to 12 intact. Power, tone ,sensation and reflexes normal throughout. No disturbance in gait. No tremor.  Skin: Intact, no ulceration, erythema , scaling or rash noted. Pigmentation normal throughout  Psych; Normal mood and affect. Judgement and concentration  normal   Assessment & Plan:  Annual physical exam Annual exam as documented. Counseling done  re healthy lifestyle involving commitment to 150 minutes exercise per week, heart healthy diet, and attaining healthy weight.The importance of adequate sleep also discussed.Changes in health habits are decided on by the patient with goals and time frames  set for achieving them. Immunization and cancer screening needs are specifically addressed at this visit.   Need for immunization against influenza After obtaining informed consent, the vaccine is  administered .   Obesity (BMI 30.0-34.9) Deteriorated. Patient re-educated about  the importance of commitment to a  minimum of 150 minutes of exercise per week.  The importance of healthy food choices with portion control discussed. Encouraged to start a food diary, count calories and to consider  joining a support group. Sample diet sheets offered. Goals set by the patient for the next several months.   Weight /BMI 01/31/2018 03/31/2016 11/17/2015  WEIGHT 199 lb 1.9 oz 184 lb 0.6 oz 198 lb 12.8 oz  HEIGHT 5\' 6"  5\' 6"  5\' 6"   BMI 32.14 kg/m2 29.7 kg/m2 32.09 kg/m2   Start half phentermine daily

## 2018-02-04 NOTE — Assessment & Plan Note (Signed)
After obtaining informed consent, the vaccine is  administered 

## 2018-02-04 NOTE — Assessment & Plan Note (Signed)
Deteriorated. Patient re-educated about  the importance of commitment to a  minimum of 150 minutes of exercise per week.  The importance of healthy food choices with portion control discussed. Encouraged to start a food diary, count calories and to consider  joining a support group. Sample diet sheets offered. Goals set by the patient for the next several months.   Weight /BMI 01/31/2018 03/31/2016 11/17/2015  WEIGHT 199 lb 1.9 oz 184 lb 0.6 oz 198 lb 12.8 oz  HEIGHT 5\' 6"  5\' 6"  5\' 6"   BMI 32.14 kg/m2 29.7 kg/m2 32.09 kg/m2   Start half phentermine daily

## 2018-06-01 ENCOUNTER — Ambulatory Visit: Payer: Self-pay | Admitting: Family Medicine

## 2018-06-05 ENCOUNTER — Ambulatory Visit: Payer: BLUE CROSS/BLUE SHIELD | Admitting: Family Medicine

## 2018-06-05 ENCOUNTER — Other Ambulatory Visit: Payer: Self-pay

## 2018-06-14 ENCOUNTER — Encounter: Payer: Self-pay | Admitting: Family Medicine

## 2018-06-14 ENCOUNTER — Ambulatory Visit (INDEPENDENT_AMBULATORY_CARE_PROVIDER_SITE_OTHER): Payer: BLUE CROSS/BLUE SHIELD | Admitting: Family Medicine

## 2018-06-14 VITALS — Ht 66.0 in | Wt 186.0 lb

## 2018-06-14 DIAGNOSIS — K219 Gastro-esophageal reflux disease without esophagitis: Secondary | ICD-10-CM

## 2018-06-14 DIAGNOSIS — E669 Obesity, unspecified: Secondary | ICD-10-CM

## 2018-06-14 DIAGNOSIS — J3089 Other allergic rhinitis: Secondary | ICD-10-CM

## 2018-06-14 DIAGNOSIS — J302 Other seasonal allergic rhinitis: Secondary | ICD-10-CM

## 2018-06-14 NOTE — Progress Notes (Signed)
Virtual Visit via Video Note   This visit type was conducted due to national recommendations for restrictions regarding the COVID-19 Pandemic (e.g. social distancing) in an effort to limit this patient's exposure and mitigate transmission in our community.  Due to her co-morbid illnesses, this patient is at least at moderate risk for complications without adequate follow up.  This format is felt to be most appropriate for this patient at this time.  All issues noted in this document were discussed and addressed.  A limited physical exam was performed with this format.    Evaluation Performed:  Follow-up visit  Date:  06/14/2018   ID:  [name], DOB 20-Jul-1980, MRN [id number]  Patient Location: Home Provider Location: Other:  Telehealth  Location of Patient: Home Location of Provider: Telehealth Consent was obtain for visit to be over via telehealth. I verified that I am speaking with the correct person using two identifiers.  PCP:  Fayrene Helper, MD   Chief Complaint:  Follow up for weight loss  History of Present Illness:    [name] is a 38 y.o. female patient of Dr. Griffin Dakin.  [name] has a history of seasonal allergies, GERD, obesity, snoring, chronic hoarseness.  Presents today for follow-up on weight loss.  Was previously on phentermine.  Reports that she does not feel like she needs the phentermine anymore at this time.  She is taking the last 5 months to lose 13 pounds but has implemented some healthy habits along the way.  And feels that she can continue without having to take the medication.  Obesity: Changes made included drinking increased amounts of water, avoiding bread as this was her weakness.  She is not very big on sweets.  She has been substituting bread with extra vegetables, fruits. Does not use a lot of oils, butters, creams, or sauces.  Majority of protein is chicken or fish, avoiding red meat as much as possible.  Overall [name] feels like  she is doing well.  She has no complaints today in the office.  Her allergies are pretty controlled she is self managing them right now.  She has not had too much issue with her GERD she implemented some diet changes and that is helped her over the course the last couple months as well.  Denies having any fever, chills, cough, shortness of breath.  She denied having any vision changes, headaches, palpitations, chest pain, chest tightness.  She denied having any of these at the same time that she was taking the phentermine as well.  The patient does not have symptoms concerning for COVID-19 infection (fever, chills, cough, or new shortness of breath).   Past Medical, Surgical, Social History, Allergies, and Medications have been Reviewed.   Past Medical History:  Diagnosis Date  . Anxiety   . No pertinent past medical history    Past Surgical History:  Procedure Laterality Date  . ABDOMINAL HYSTERECTOMY  2013  . CESAREAN SECTION  2012   twins  . ESOPHAGOGASTRODUODENOSCOPY N/A 05/02/2013   Procedure: ESOPHAGOGASTRODUODENOSCOPY (EGD);  Surgeon: Rogene Houston, MD;  Location: [facility];  Service: Endoscopy;  Laterality: N/A;  230  . LAPAROSCOPIC ASSISTED VAGINAL HYSTERECTOMY  12/13/2011   Procedure: LAPAROSCOPIC ASSISTED VAGINAL HYSTERECTOMY;  Surgeon: Luz Lex, MD;  Location: [facility];  Service: Gynecology;  Laterality: N/A;  . TONSILLECTOMY    . WISDOM TOOTH EXTRACTION       No outpatient medications have been  marked as taking for the 06/14/18 encounter (Appointment) with Perlie Mayo, NP.     Allergies:   Patient has no known allergies.   Social History   Tobacco Use  . Smoking status: Never Smoker  . Smokeless tobacco: Never Used  Substance Use Topics  . Alcohol use: No    Alcohol/week: 0.0 standard drinks  . Drug use: No     Family Hx: The patient's family history includes Allergies in her mother; Hyperlipidemia in her mother; Hypertension in her father and mother.   ROS:   Please see the history of present illness.    Review of Systems  Constitutional: Negative for chills, fever and malaise/fatigue.  HENT: Negative for congestion and sinus pain.   Eyes: Negative.   Respiratory: Negative for cough, shortness of breath and wheezing.   Cardiovascular: Negative for chest pain, palpitations and leg swelling.  Gastrointestinal: Negative for constipation, diarrhea and heartburn.  Genitourinary: Negative.   Musculoskeletal: Negative.   Skin: Negative for rash.  Neurological: Negative for dizziness and headaches.  Endo/Heme/Allergies: Negative.   Psychiatric/Behavioral: Negative.   All other systems reviewed and are negative.  Labs/Other Tests and Data Reviewed:    Recent Labs: 02/02/2018: ALT 10; BUN 10; Creat 0.75; Hemoglobin 13.8; Platelets 247; Potassium 4.1; Sodium 136; TSH 0.70   Recent Lipid Panel Lab Results  Component Value Date/Time   CHOL 164 02/02/2018 10:51 AM   TRIG 59 02/02/2018 10:51 AM   HDL 68 02/02/2018 10:51 AM   CHOLHDL 2.4 02/02/2018 10:51 AM   LDLCALC 82 02/02/2018 10:51 AM    Wt Readings from Last 3 Encounters:  01/31/18 199 lb 1.9 oz (90.3 kg)  03/31/16 184 lb 0.6 oz (83.5 kg)  11/17/15 198 lb 12.8 oz (90.2 kg)     Objective:    Vital Signs:  LMP 11/21/2011     Physical Exam Constitutional:      General: She is not in acute distress.    Appearance: Normal appearance. She is obese. She is not ill-appearing.  HENT:     Head: Normocephalic and atraumatic.     Right Ear: External ear normal.     Left Ear: External ear normal.     Nose: Nose normal.  Eyes:     General: No scleral icterus.       Right eye: No discharge.        Left eye: No discharge.     Conjunctiva/sclera: Conjunctivae normal.  Neck:     Musculoskeletal: Normal range of motion.  Pulmonary:     Effort: No accessory muscle usage or respiratory distress.     Comments: No noted shortness of breath in communication Neurological:     Mental  Status: She is alert and oriented to person, place, and time.  Psychiatric:        Attention and Perception: Attention and perception normal.        Mood and Affect: Mood and affect normal.        Speech: Speech normal.        Behavior: Behavior normal. Behavior is cooperative.        Thought Content: Thought content normal.        Cognition and Memory: Cognition and memory normal.        Judgment: Judgment normal.     ASSESSMENT & PLAN:    1. Obesity (BMI 30.0-34.9) Improved, 13 pound weight loss since beginning of the year.  Does not desire to stay on phentermine any longer.  Would  like to see if the changes that she is implemented have and will take effect.  We will be following up in 4 months at that time we will decide if she needs to get back on phentermine and or if she can do this without phentermine.  Continue to implement heart healthy, low-fat diet.  Avoiding red meats.  Eating lots of fish and chicken.  Avoiding oils, butters, sauces and things of high saturated fat.  Incorporating more fruits and vegetables in diet.  Additionally educated on the need for continued workout and exercise, implementing 30 minutes of some form of continuous exercise daily if possible.  2. Gastroesophageal reflux disease, esophagitis presence not specified Controlled, reports noted improvement with weight loss, diet changes. no changes at this time.  Self management.   3. Seasonal and perennial allergic rhinitis Controlled, no changes at this time.  Self management  Time:   Today, I have spent 10 minutes with the patient with telehealth technology discussing the above problems.     Medication Adjustments/Labs and Tests Ordered: Current medicines are reviewed at length with the patient today.  Concerns regarding medicines are outlined above.   Tests Ordered: No orders of the defined types were placed in this encounter.   Medication Changes: No orders of the defined types were placed in this  encounter.   Disposition:  Follow up in 5 month(s)  Signed, Perlie Mayo, NP  06/14/2018 8:11 AM     [facility]

## 2018-06-14 NOTE — Patient Instructions (Addendum)
    Thank you for completing your visit via WebEx.  I appreciate the opportunity to provide you with the care for your health and wellness. Today we discussed: Overall health and your weight loss  CONGRATULATIONS on your 13 POUND WEIGHT LOSS!  Continue to keep up the good habits you shared with me today.  We will look forward to seeing you back in the office in the coming months and hope to see a continued weight loss and overall wellness for you at that time.  If you need [place] before the next appt just let us know! We are here for you.  Please continue to practice social distancing to keep you, your family, and our community safe.   [place] YOUR HANDS WELL AND FREQUENTLY. AVOID TOUCHING YOUR FACE, UNLESS YOUR HANDS ARE FRESHLY WASHED.  GET FRESH AIR DAILY. STAY HYDRATED WITH WATER.   It was a pleasure to see you and I look forward to continuing to work together on your health and well-being. Please do not hesitate to call the office if you need care or have questions about your care.  Have a wonderful day and week. With Gratitude, Cherly Beach, DNP, AGNP-BC

## 2018-08-31 ENCOUNTER — Encounter: Payer: Self-pay | Admitting: Family Medicine

## 2018-09-14 ENCOUNTER — Ambulatory Visit: Payer: BLUE CROSS/BLUE SHIELD | Admitting: Family Medicine

## 2018-12-14 ENCOUNTER — Other Ambulatory Visit: Payer: Self-pay | Admitting: Orthopedic Surgery

## 2018-12-14 DIAGNOSIS — M259 Joint disorder, unspecified: Secondary | ICD-10-CM

## 2018-12-26 ENCOUNTER — Other Ambulatory Visit: Payer: Self-pay | Admitting: *Deleted

## 2018-12-26 ENCOUNTER — Ambulatory Visit
Admission: RE | Admit: 2018-12-26 | Discharge: 2018-12-26 | Disposition: A | Discharge status: Home / Self Care | Payer: 59 | Source: Ambulatory Visit | Attending: Orthopedic Surgery | Admitting: Orthopedic Surgery

## 2018-12-26 ENCOUNTER — Other Ambulatory Visit: Payer: Self-pay

## 2018-12-26 DIAGNOSIS — M259 Joint disorder, unspecified: Secondary | ICD-10-CM

## 2018-12-26 DIAGNOSIS — Z20822 Contact with and (suspected) exposure to covid-19: Secondary | ICD-10-CM

## 2018-12-27 LAB — NOVEL CORONAVIRUS, NAA: SARS-CoV-2, NAA: NOT DETECTED

## 2019-02-27 ENCOUNTER — Ambulatory Visit (INDEPENDENT_AMBULATORY_CARE_PROVIDER_SITE_OTHER): Payer: 59 | Admitting: Family Medicine

## 2019-02-27 ENCOUNTER — Other Ambulatory Visit: Payer: Self-pay

## 2019-02-27 VITALS — BP 120/64 | Ht 66.0 in | Wt 188.0 lb

## 2019-02-27 DIAGNOSIS — E559 Vitamin D deficiency, unspecified: Secondary | ICD-10-CM

## 2019-02-27 DIAGNOSIS — Z13220 Encounter for screening for lipoid disorders: Secondary | ICD-10-CM

## 2019-02-27 DIAGNOSIS — R430 Anosmia: Secondary | ICD-10-CM

## 2019-02-27 DIAGNOSIS — R432 Parageusia: Secondary | ICD-10-CM

## 2019-02-27 DIAGNOSIS — J0100 Acute maxillary sinusitis, unspecified: Secondary | ICD-10-CM

## 2019-02-27 DIAGNOSIS — E669 Obesity, unspecified: Secondary | ICD-10-CM | POA: Diagnosis not present

## 2019-02-27 MED ORDER — PREDNISONE 5 MG PO TABS: 5.0000 mg | ORAL_TABLET | Freq: Two times a day (BID) | ORAL | 0 refills | Status: AC

## 2019-02-27 MED ORDER — SULFAMETHOXAZOLE-TRIMETHOPRIM 800-160 MG PO TABS: 1.0000 | ORAL_TABLET | Freq: Two times a day (BID) | ORAL | 0 refills | Status: DC

## 2019-02-27 NOTE — Progress Notes (Signed)
Virtual Visit via Telephone Note  I connected with [name] on 02/27/19 at  2:20 PM EST by telephone and verified that I am speaking with the correct person using two identifiers.  Location: Patient: home Provider: office   I discussed the limitations, risks, security and privacy concerns of performing an evaluation and management service by telephone and the availability of in person appointments. I also discussed with the patient that there may be a patient responsible charge related to this service. The patient expressed understanding and agreed to proceed.   History of Present Illness: Runny nose last week Thursday, took AlleveD mild clearing, then Friday night, nausea and vomit, no smell or taste and feeling really badly on Saturday, tested in [place] report is negative. No appetite, fatigue , left facial pressure with headache between 7  No sore throat, ear pain or productive cough, sputum is scant Prior to this has been well. Works with children.no known Covid contact Denies chest pains, palpitations and leg swelling Denies abdominal pain, nausea, vomiting,diarrhea or constipation.   Denies dysuria, frequency, hesitancy or incontinence. Denies joint pain, swelling and limitation in mobility. Denies headaches, seizures, numbness, or tingling. Denies depression, anxiety or insomnia. Denies skin break down or rash. Not exercising or following diet as before, with weight gain, intends to reverse this       Observations/Objective: BP 120/64   Ht 5\' 6"  (1.676 m)   Wt 188 lb (85.3 kg)   LMP 11/21/2011   BMI 30.34 kg/m  Good communication with no confusion and intact memory. Alert and oriented x 3 No signs of respiratory distress during speech    Assessment and Plan:  Acute sinusitis Septra and prednisone prescribed, work excuse also provided  Loss of taste Recommend rept covid testing, short course of prednisone prescribed  Loss of smell Short course  prednisone and re test   Follow Up Instructions:    I discussed the assessment and treatment plan with the patient. The patient was provided an opportunity to ask questions and all were answered. The patient agreed with the plan and demonstrated an understanding of the instructions.   The patient was advised to call back or seek an in-person evaluation if the symptoms worsen or if the condition fails to improve as anticipated.  I provided 10 minutes of non-face-to-face time during this encounter.   Tula Nakayama, MD

## 2019-02-27 NOTE — Patient Instructions (Addendum)
F/U in office with MD in 4.5  months, call if you need me sooner  Pt to send message or please call her  for info to send work note from 01/25 to return 03/05/2019.  Needs fasting cBC, lipid, cmp and EGFr, TSH and vitamin D 1 week before next appointment  You are treated for sinusitis  I do recommend repeat covid testing this week Thursday or Friday  Please self quarantine in the home for te next several days.  Monito symptoms , temperature, breathing, overall strength and wellbeing, if you worsen please go to the nearest urgent Care or Ed for evaluation  

## 2019-03-01 ENCOUNTER — Encounter: Payer: Self-pay | Admitting: Family Medicine

## 2019-03-01 DIAGNOSIS — R432 Parageusia: Secondary | ICD-10-CM | POA: Insufficient documentation

## 2019-03-01 DIAGNOSIS — R430 Anosmia: Secondary | ICD-10-CM | POA: Insufficient documentation

## 2019-03-01 NOTE — Assessment & Plan Note (Signed)
Septra and prednisone prescribed, work excuse also provided

## 2019-03-01 NOTE — Assessment & Plan Note (Signed)
Short course prednisone and re test

## 2019-03-01 NOTE — Assessment & Plan Note (Signed)
Recommend rept covid testing, short course of prednisone prescribed

## 2019-04-03 ENCOUNTER — Ambulatory Visit: Payer: 59 | Attending: Internal Medicine

## 2019-04-03 DIAGNOSIS — Z23 Encounter for immunization: Secondary | ICD-10-CM | POA: Insufficient documentation

## 2019-04-03 NOTE — Progress Notes (Signed)
   Covid-19 Vaccination Clinic  Name:  [name]    MRN: [id number] DOB: October 31, 1980  04/03/2019  [name] was observed post Covid-19 immunization for 15 minutes without incident. She was provided with Vaccine Information Sheet and instruction to access the V-Safe system.   [name] was instructed to call 911 with any severe reactions post vaccine: Marland Kitchen Difficulty breathing  . Swelling of face and throat  . A fast heartbeat  . A bad rash all over body  . Dizziness and weakness   Immunizations Administered    Name Date Dose VIS Date Route   Moderna COVID-19 Vaccine 04/03/2019 11:55 AM 0.5 mL 01/02/2019 Intramuscular   Manufacturer: Moderna   Lot: [id number]   [id number]

## 2019-05-01 ENCOUNTER — Ambulatory Visit: Payer: 59 | Attending: Internal Medicine

## 2019-05-01 DIAGNOSIS — Z23 Encounter for immunization: Secondary | ICD-10-CM

## 2019-05-01 NOTE — Progress Notes (Signed)
   Covid-19 Vaccination Clinic  Name:  [name]    MRN: [id number] DOB: 19-Dec-1980  05/01/2019  [name] was observed post Covid-19 immunization for 15 minutes without incident. She was provided with Vaccine Information Sheet and instruction to access the V-Safe system.   [name] was instructed to call 911 with any severe reactions post vaccine: Marland Kitchen Difficulty breathing  . Swelling of face and throat  . A fast heartbeat  . A bad rash all over body  . Dizziness and weakness   Immunizations Administered    Name Date Dose VIS Date Route   Moderna COVID-19 Vaccine 05/01/2019 12:08 PM 0.5 mL 01/02/2019 Intramuscular   Manufacturer: Moderna   Lot: [id number]   [id number]

## 2019-07-04 ENCOUNTER — Other Ambulatory Visit: Payer: Self-pay

## 2019-07-04 ENCOUNTER — Ambulatory Visit (INDEPENDENT_AMBULATORY_CARE_PROVIDER_SITE_OTHER): Payer: 59 | Admitting: Family Medicine

## 2019-07-04 ENCOUNTER — Encounter: Payer: Self-pay | Admitting: Family Medicine

## 2019-07-04 VITALS — BP 110/74 | HR 68 | Resp 16 | Ht 66.0 in | Wt 199.0 lb

## 2019-07-04 DIAGNOSIS — E669 Obesity, unspecified: Secondary | ICD-10-CM

## 2019-07-04 DIAGNOSIS — E559 Vitamin D deficiency, unspecified: Secondary | ICD-10-CM | POA: Diagnosis not present

## 2019-07-04 DIAGNOSIS — Z1231 Encounter for screening mammogram for malignant neoplasm of breast: Secondary | ICD-10-CM | POA: Diagnosis not present

## 2019-07-04 DIAGNOSIS — M23300 Other meniscus derangements, unspecified lateral meniscus, right knee: Secondary | ICD-10-CM

## 2019-07-04 DIAGNOSIS — Z13220 Encounter for screening for lipoid disorders: Secondary | ICD-10-CM

## 2019-07-04 MED ORDER — PHENTERMINE HCL 37.5 MG PO TABS: ORAL_TABLET | ORAL | 1 refills | Status: DC

## 2019-07-04 NOTE — Progress Notes (Signed)
   [name]     MRN: [id number]      DOB: February 25, 1980   HPI [name] is here for follow up and re-evaluation of chronic medical conditions, medication management and review of any available recent lab and radiology data.  Preventive health is updated, specifically  Cancer screening and Immunization.   Questions or concerns regarding consultations or procedures which the PT has had in the interim are  addressed. The PT denies any adverse reactions to current medications since the last visit.  C/o weight gain, wants to resume phentamine, unable to exercise currently as her right knee is damaged , ha surgery in the next 2 weeks   ROS Denies recent fever or chills. Denies sinus pressure, nasal congestion, ear pain or sore throat. Denies chest congestion, productive cough or wheezing. Denies chest pains, palpitations and leg swelling Denies abdominal pain, nausea, vomiting,diarrhea or constipation.   Denies dysuria, frequency, hesitancy or incontinence. . Denies headaches, seizures, numbness, or tingling. Denies depression, anxiety or insomnia. Denies skin break down or rash.   PE  BP 110/74   Pulse 68   Resp 16   Ht 5\' 6"  (1.676 m)   Wt 199 lb (90.3 kg)   LMP 11/21/2011   SpO2 95%   BMI 32.12 kg/m   Patient alert and oriented and in no cardiopulmonary distress.  HEENT: No facial asymmetry, EOMI,     Neck supple .  Chest: Clear to auscultation bilaterally.  CVS: S1, S2 no murmurs, no S3.Regular rate.  ABD: Soft non tender.   Ext: No edema  MS: Adequate ROM spine, shoulders, hips and reduced I right knee  Skin: Intact, no ulcerations or rash noted.  Psych: Good eye contact, normal affect. Memory intact not anxious or depressed appearing.  CNS: CN 2-12 intact, power,  normal throughout.no focal deficits noted.   Assessment & Plan  Obesity (BMI 30.0-34.9)  Patient re-educated about  the importance of commitment to a  minimum of 150 minutes of exercise per week as  able.  The importance of healthy food choices with portion control discussed, as well as eating regularly and within a 12 hour window most days. The need to choose "clean , green" food 50 to 75% of the time is discussed, as well as to make water the primary drink and set a goal of 64 ounces water daily.    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

## 2019-07-04 NOTE — Patient Instructions (Signed)
Annual physical exam with MD and pap in 3.5 months, call if you need me before  New is phentermine half tablet once daily  Mammogram to be scheduled at checkout  Please change food choice and eat on a regular schedule  Goal is 3 pounds per month weight loss, hope is that you are 10 to 12 pounds less at follow up  Fasting cBC, lipid, cmp and EGFr, TSH, and Vit D this week please  1500 cal diet sheet provided  64 ounces  Water daily is recommended  Aim to eat between 7: 30 and 7 pm  Every day  Best with right knee arthroscopy in mid June  Thanks for choosing [facility], we consider it a privelige to serve you.

## 2019-07-06 ENCOUNTER — Encounter: Payer: Self-pay | Admitting: Family Medicine

## 2019-07-06 DIAGNOSIS — M23300 Other meniscus derangements, unspecified lateral meniscus, right knee: Secondary | ICD-10-CM | POA: Insufficient documentation

## 2019-07-06 LAB — CBC
HCT: 42.4 % (ref 35.0–45.0)
Hemoglobin: 14.1 g/dL (ref 11.7–15.5)
MCH: 31.3 pg (ref 27.0–33.0)
MCHC: 33.3 g/dL (ref 32.0–36.0)
MCV: 94.2 fL (ref 80.0–100.0)
MPV: 10.2 fL (ref 7.5–12.5)
Platelets: 269 10*3/uL (ref 140–400)
RBC: 4.5 10*6/uL (ref 3.80–5.10)
RDW: 12.2 % (ref 11.0–15.0)
WBC: 4.9 10*3/uL (ref 3.8–10.8)

## 2019-07-06 LAB — LIPID PANEL
Cholesterol: 161 mg/dL (ref <200)
HDL: 64 mg/dL (ref >=50)
LDL Cholesterol (Calc): 83 mg/dL (calc)
Non-HDL Cholesterol (Calc): 97 mg/dL (calc) (ref <130)
Total CHOL/HDL Ratio: 2.5 (calc) (ref <5.0)
Triglycerides: 60 mg/dL (ref <150)

## 2019-07-06 LAB — COMPLETE METABOLIC PANEL WITH GFR
AG Ratio: 1.6 (calc) (ref 1.0–2.5)
ALT: 11 U/L (ref 6–29)
AST: 28 U/L (ref 10–30)
Albumin: 4.1 g/dL (ref 3.6–5.1)
Alkaline phosphatase (APISO): 46 U/L (ref 31–125)
BUN: 9 mg/dL (ref 7–25)
CO2: 29 mmol/L (ref 20–32)
Calcium: 9.4 mg/dL (ref 8.6–10.2)
Chloride: 103 mmol/L (ref 98–110)
Creat: 0.7 mg/dL (ref 0.50–1.10)
GFR, Est African American: 126 mL/min/{1.73_m2} (ref >=60)
GFR, Est Non African American: 108 mL/min/{1.73_m2} (ref >=60)
Globulin: 2.6 g/dL (calc) (ref 1.9–3.7)
Glucose, Bld: 80 mg/dL (ref 65–99)
Potassium: 4.3 mmol/L (ref 3.5–5.3)
Sodium: 137 mmol/L (ref 135–146)
Total Bilirubin: 0.5 mg/dL (ref 0.2–1.2)
Total Protein: 6.7 g/dL (ref 6.1–8.1)

## 2019-07-06 LAB — VITAMIN D 25 HYDROXY (VIT D DEFICIENCY, FRACTURES): Vit D, 25-Hydroxy: 28 ng/mL — ABNORMAL LOW (ref 30–100)

## 2019-07-06 LAB — TSH: TSH: 0.9 mIU/L

## 2019-07-06 NOTE — Assessment & Plan Note (Signed)
Disabling , has scheduled surgery in 2 weeks

## 2019-07-06 NOTE — Assessment & Plan Note (Signed)
  Patient re-educated about  the importance of commitment to a  minimum of 150 minutes of exercise per week as able.  The importance of healthy food choices with portion control discussed, as well as eating regularly and within a 12 hour window most days. The need to choose "clean , green" food 50 to 75% of the time is discussed, as well as to make water the primary drink and set a goal of 64 ounces water daily.    Weight /BMI 07/04/2019 02/27/2019 06/14/2018  WEIGHT 199 lb 188 lb 186 lb  HEIGHT 5\' 6"  5\' 6"  5\' 6"   BMI 32.12 kg/m2 30.34 kg/m2 30.02 kg/m2    Phentermine prescribed also

## 2019-07-09 ENCOUNTER — Encounter: Payer: Self-pay | Admitting: Family Medicine

## 2019-09-12 ENCOUNTER — Other Ambulatory Visit: Payer: Self-pay

## 2019-09-12 ENCOUNTER — Ambulatory Visit (HOSPITAL_COMMUNITY)
Admission: RE | Admit: 2019-09-12 | Discharge: 2019-09-12 | Disposition: A | Discharge status: Home / Self Care | Payer: 59 | Source: Ambulatory Visit | Attending: Family Medicine | Admitting: Family Medicine

## 2019-09-12 ENCOUNTER — Other Ambulatory Visit (HOSPITAL_COMMUNITY): Payer: Self-pay | Admitting: Family Medicine

## 2019-09-12 DIAGNOSIS — Z1231 Encounter for screening mammogram for malignant neoplasm of breast: Secondary | ICD-10-CM | POA: Diagnosis not present

## 2019-11-14 ENCOUNTER — Encounter: Payer: 59 | Admitting: Family Medicine

## 2020-11-11 ENCOUNTER — Other Ambulatory Visit (HOSPITAL_COMMUNITY): Payer: Self-pay | Admitting: Family Medicine

## 2020-11-11 DIAGNOSIS — Z1231 Encounter for screening mammogram for malignant neoplasm of breast: Secondary | ICD-10-CM

## 2020-11-12 ENCOUNTER — Ambulatory Visit (HOSPITAL_COMMUNITY)
Admission: RE | Admit: 2020-11-12 | Discharge: 2020-11-12 | Disposition: A | Discharge status: Home / Self Care | Payer: 59 | Source: Ambulatory Visit | Attending: Family Medicine | Admitting: Family Medicine

## 2020-11-12 ENCOUNTER — Other Ambulatory Visit: Payer: Self-pay

## 2020-11-12 DIAGNOSIS — Z1231 Encounter for screening mammogram for malignant neoplasm of breast: Secondary | ICD-10-CM | POA: Insufficient documentation

## 2020-11-19 ENCOUNTER — Encounter: Payer: 59 | Admitting: Family Medicine

## 2020-11-25 ENCOUNTER — Other Ambulatory Visit (HOSPITAL_COMMUNITY)
Admission: RE | Admit: 2020-11-25 | Discharge: 2020-11-25 | Disposition: A | Discharge status: Home / Self Care | Payer: 59 | Source: Ambulatory Visit | Attending: Family Medicine | Admitting: Family Medicine

## 2020-11-25 ENCOUNTER — Ambulatory Visit (INDEPENDENT_AMBULATORY_CARE_PROVIDER_SITE_OTHER): Payer: 59 | Admitting: Family Medicine

## 2020-11-25 ENCOUNTER — Encounter: Payer: Self-pay | Admitting: Family Medicine

## 2020-11-25 ENCOUNTER — Other Ambulatory Visit: Payer: Self-pay

## 2020-11-25 VITALS — BP 128/85 | HR 73 | Resp 17 | Ht 66.0 in | Wt 212.0 lb

## 2020-11-25 DIAGNOSIS — Z23 Encounter for immunization: Secondary | ICD-10-CM | POA: Diagnosis not present

## 2020-11-25 DIAGNOSIS — Z0000 Encounter for general adult medical examination without abnormal findings: Secondary | ICD-10-CM

## 2020-11-25 DIAGNOSIS — Z124 Encounter for screening for malignant neoplasm of cervix: Secondary | ICD-10-CM | POA: Diagnosis not present

## 2020-11-25 NOTE — Progress Notes (Signed)
    [name]     MRN: [id number]      DOB: Jan 21, 1981  HPI: Patient is in for annual physical exam. C/o fatigue and weight re gain Immunization is reviewed , and  updated .   PE: BP 128/85   Pulse 73   Resp 17   Ht 5\' 6"  (1.676 m)   Wt 212 lb (96.2 kg)   LMP 11/21/2011   SpO2 93%   BMI 34.22 kg/m   Pleasant  female, alert and oriented x 3, in no cardio-pulmonary distress. Afebrile. HEENT No facial trauma or asymetry. Sinuses non tender.  Extra occullar muscles intact.. External ears normal, . Neck: supple, no adenopathy,JVD or thyromegaly.No bruits.  Chest: Clear to ascultation bilaterally.No crackles or wheezes. Non tender to palpation   Cardiovascular system; Heart sounds normal,  S1 and  S2 ,no S3.  No murmur, or thrill. Apical beat not displaced Peripheral pulses normal.  Abdomen: Soft, non tender, no organomegaly or masses. No bruits. Bowel sounds normal. No guarding, tenderness or rebound.   GU: External genitalia normal female genitalia , normal female distribution of hair. No lesions. Urethral meatus normal in size, no  Prolapse, no lesions visibly  Present. Bladder non tender. Vagina pink and moist , with no visible lesions , discharge present . Adequate pelvic support no  cystocele or rectocele noted  Uterus absent , no adnexal masses, no  adnexal tenderness.   Musculoskeletal exam: Full ROM of spine, hips , shoulders and knees. No deformity ,swelling or crepitus noted. No muscle wasting or atrophy.   Neurologic: Cranial nerves 2 to 12 intact. Power, tone ,sensation and reflexes normal throughout. No disturbance in gait. No tremor.  Skin: Intact, no ulceration, erythema , scaling or rash noted. Bruise to leg s/p recent trauma,no tenderness , redness or warmth Pigmentation normal throughout  Psych; Normal mood and affect. Judgement and concentration normal   Assessment & Plan:  Annual physical exam Annual exam as  documented. Counseling done  re healthy lifestyle involving commitment to 150 minutes exercise per week, heart healthy diet, and attaining healthy weight.The importance of adequate sleep also discussed. Regular seat belt use and home safety, is also discussed. Changes in health habits are decided on by the patient with goals and time frames  set for achieving them. Immunization and cancer screening needs are specifically addressed at this visit.

## 2020-11-25 NOTE — Assessment & Plan Note (Signed)

## 2020-11-25 NOTE — Patient Instructions (Addendum)
F/u in 10 weks, xcall if you need me sooner  Pap sent  Fasting CBC, lipid, cmp and EGFR, TSH, vit D and hepatitis c screen this week  Flu vaccine today  Need the current covid vaccine , please get at your pharmacy  Will refer for sleep study if labs do not explain fatigue  Will plan to use medication to help with weight loss as discussed once labs are reviewed  Thanks for choosing [facility], we consider it a privelige to serve you.  It is important that you exercise regularly at least 30 minutes 5 times a week. If you develop chest pain, have severe difficulty breathing, or feel very tired, stop exercising immediately and seek medical attention   Think about what you will eat, plan ahead. Choose " clean, green, fresh or frozen" over canned, processed or packaged foods which are more sugary, salty and fatty. 70 to 75% of food eaten should be vegetables and fruit. Three meals at set times with snacks allowed between meals, but they must be fruit or vegetables. Aim to eat over a 12 hour period , example 7 am to 7 pm, and STOP after  your last meal of the day. Drink water,generally about 64 ounces per day, no other drink is as healthy. Fruit juice is best enjoyed in a healthy way, by EATING the fruit.

## 2020-11-26 ENCOUNTER — Other Ambulatory Visit: Payer: Self-pay

## 2020-11-26 DIAGNOSIS — K219 Gastro-esophageal reflux disease without esophagitis: Secondary | ICD-10-CM

## 2020-11-26 DIAGNOSIS — Z13220 Encounter for screening for lipoid disorders: Secondary | ICD-10-CM

## 2020-11-26 DIAGNOSIS — Z1159 Encounter for screening for other viral diseases: Secondary | ICD-10-CM

## 2020-11-26 DIAGNOSIS — E669 Obesity, unspecified: Secondary | ICD-10-CM

## 2020-11-26 DIAGNOSIS — E559 Vitamin D deficiency, unspecified: Secondary | ICD-10-CM

## 2020-11-27 LAB — CMP14+EGFR
ALT: 11 IU/L (ref 0–32)
AST: 27 IU/L (ref 0–40)
Albumin/Globulin Ratio: 1.7 (ref 1.2–2.2)
Albumin: 4.3 g/dL (ref 3.8–4.8)
Alkaline Phosphatase: 50 IU/L (ref 44–121)
BUN/Creatinine Ratio: 10 (ref 9–23)
BUN: 8 mg/dL (ref 6–24)
Bilirubin Total: 0.6 mg/dL (ref 0.0–1.2)
CO2: 20 mmol/L (ref 20–29)
Calcium: 9.3 mg/dL (ref 8.7–10.2)
Chloride: 104 mmol/L (ref 96–106)
Creatinine, Ser: 0.81 mg/dL (ref 0.57–1.00)
Globulin, Total: 2.6 g/dL (ref 1.5–4.5)
Glucose: 83 mg/dL (ref 70–99)
Potassium: 4.5 mmol/L (ref 3.5–5.2)
Sodium: 138 mmol/L (ref 134–144)
Total Protein: 6.9 g/dL (ref 6.0–8.5)
eGFR: 93 mL/min/{1.73_m2} (ref >59)

## 2020-11-27 LAB — CBC
Hematocrit: 41.3 % (ref 34.0–46.6)
Hemoglobin: 13.7 g/dL (ref 11.1–15.9)
MCH: 30.5 pg (ref 26.6–33.0)
MCHC: 33.2 g/dL (ref 31.5–35.7)
MCV: 92 fL (ref 79–97)
Platelets: 251 10*3/uL (ref 150–450)
RBC: 4.49 x10E6/uL (ref 3.77–5.28)
RDW: 12.3 % (ref 11.7–15.4)
WBC: 4.3 10*3/uL (ref 3.4–10.8)

## 2020-11-27 LAB — TSH: TSH: 1.14 u[IU]/mL (ref 0.450–4.500)

## 2020-11-27 LAB — LIPID PANEL
Chol/HDL Ratio: 2.8 ratio (ref 0.0–4.4)
Cholesterol, Total: 164 mg/dL (ref 100–199)
HDL: 58 mg/dL (ref >39)
LDL Chol Calc (NIH): 94 mg/dL (ref 0–99)
Triglycerides: 61 mg/dL (ref 0–149)
VLDL Cholesterol Cal: 12 mg/dL (ref 5–40)

## 2020-11-27 LAB — VITAMIN D 25 HYDROXY (VIT D DEFICIENCY, FRACTURES): Vit D, 25-Hydroxy: 25.9 ng/mL — ABNORMAL LOW (ref 30.0–100.0)

## 2020-11-27 LAB — HEPATITIS C ANTIBODY: Hep C Virus Ab: <0.1 s/co ratio (ref 0.0–0.9)

## 2020-12-01 ENCOUNTER — Other Ambulatory Visit: Payer: Self-pay | Admitting: Family Medicine

## 2020-12-01 LAB — CYTOLOGY - PAP
Adequacy: ABSENT
Comment: NEGATIVE
Diagnosis: NEGATIVE
High risk HPV: NEGATIVE

## 2021-02-03 ENCOUNTER — Other Ambulatory Visit: Payer: Self-pay

## 2021-02-03 ENCOUNTER — Encounter: Payer: Self-pay | Admitting: Family Medicine

## 2021-02-03 ENCOUNTER — Ambulatory Visit (INDEPENDENT_AMBULATORY_CARE_PROVIDER_SITE_OTHER): Payer: 59 | Admitting: Family Medicine

## 2021-02-03 VITALS — BP 134/80 | HR 85 | Resp 16 | Ht 66.0 in | Wt 217.0 lb

## 2021-02-03 DIAGNOSIS — E669 Obesity, unspecified: Secondary | ICD-10-CM

## 2021-02-03 DIAGNOSIS — K5900 Constipation, unspecified: Secondary | ICD-10-CM | POA: Insufficient documentation

## 2021-02-03 DIAGNOSIS — R0683 Snoring: Secondary | ICD-10-CM

## 2021-02-03 DIAGNOSIS — K219 Gastro-esophageal reflux disease without esophagitis: Secondary | ICD-10-CM

## 2021-02-03 DIAGNOSIS — R1312 Dysphagia, oropharyngeal phase: Secondary | ICD-10-CM

## 2021-02-03 MED ORDER — LUBIPROSTONE 8 MCG PO CAPS: 8.0000 ug | ORAL_CAPSULE | Freq: Two times a day (BID) | ORAL | 3 refills | Status: DC

## 2021-02-03 MED ORDER — OMEPRAZOLE 40 MG PO CPDR: 40.0000 mg | DELAYED_RELEASE_CAPSULE | Freq: Every day | ORAL | 2 refills | Status: DC

## 2021-02-03 NOTE — Assessment & Plan Note (Signed)
Needs laxative at last twice weekly for BM, Mg citrate was effective not available and no alternative has been effective, trial of amitiza and refer GI

## 2021-02-03 NOTE — Patient Instructions (Signed)
F/u in 3 months, call if you need me sooner  Start once / twice daily amitiza for constipation  Start omeprazole once daily for reflux  You are referred urgently To GI re reflux and constipation   You are referred to Pulmonary for evaluation for possible sleep apnea, please get appointment at checkout if possible  It is important that you exercise regularly at least 30 minutes 5 times a week. If you develop chest pain, have severe difficulty breathing, or feel very tired, stop exercising immediately and seek medical attention   Think about what you will eat, plan ahead. Choose " clean, green, fresh or frozen" over canned, processed or packaged foods which are more sugary, salty and fatty. 70 to 75% of food eaten should be vegetables and fruit. Three meals at set times with snacks allowed between meals, but they must be fruit or vegetables. Aim to eat over a 12 hour period , example 7 am to 7 pm, and STOP after  your last meal of the day. Drink water,generally about 64 ounces per day, no other drink is as healthy. Fruit juice is best enjoyed in a healthy way, by EATING the fruit.  Thanks for choosing [facility], we consider it a privelige to serve you.

## 2021-02-03 NOTE — Progress Notes (Signed)
° °  [name]     MRN: [id number]      DOB: 05/25/1980   HPI Ms. Ports is here for follow up and re-evaluation of chronic medical conditions, medication management and review of any available recent lab and radiology data.  Preventive health is updated, specifically  Cancer screening and Immunization.  Has been gaining weight instead of losing and reports significant difficulty with eating. The PT denies any adverse reactions to current medications since the last visit.  Chronic constipation fopr years, worse in 6 months, no Mg citrate Severed GERD, difficulty swallowing liquid and solid, regurgitsting liquid and solid x 2 months   ROS Denies recent fever or chills. Denies sinus pressure, nasal congestion, ear pain or sore throat. Denies chest congestion, productive cough or wheezing. Denies chest pains, palpitations and leg swelling  Denies dysuria, frequency, hesitancy or incontinence. Denies joint pain, swelling and limitation in mobility. Denies headaches, seizures, numbness, or tingling. Denies depression, anxiety or insomnia. Denies skin break down or rash.   PE  BP 134/80    Pulse 85    Resp 16    Ht 5\' 6"  (1.676 m)    Wt 217 lb (98.4 kg)    LMP 11/21/2011    SpO2 96%    BMI 35.02 kg/m   Patient alert and oriented and in no cardiopulmonary distress.  HEENT: No facial asymmetry, EOMI,     Neck supple .  Chest: Clear to auscultation bilaterally.  CVS: S1, S2 no murmurs, no S3.Regular rate.  ABD: Soft no localized tenderness , guarding or rebound. Normal BS, no palpable mass or organomegaly.   Ext: No edema  MS: Adequate ROM spine, shoulders, hips and knees.  Skin: Intact, no ulcerations or rash noted.  Psych: Good eye contact, normal affect. Memory intact not anxious or depressed appearing.  CNS: CN 2-12 intact, power,  normal throughout.no focal deficits noted.   Assessment & Plan  Dysphagia Progressive solid and liquid dysphagia x 3 months with regurgitation  of both solid and liquid , needs GI eval asap  Constipation Needs laxative at last twice weekly for BM, Mg citrate was effective not available and no alternative has been effective, trial of amitiza and refer GI  GERD (gastroesophageal reflux disease) 6 month h/o worsening and uncontrolled symptoms, unable to lie flat, start omeprazole 40 mg and refer to GI  Obesity (BMI 30.0-34.9) Deteriorated  Patient re-educated about  the importance of commitment to a  minimum of 150 minutes of exercise per week as able.  The importance of healthy food choices with portion control discussed, as well as eating regularly and within a 12 hour window most days. The need to choose "clean , green" food 50 to 75% of the time is discussed, as well as to make water the primary drink and set a goal of 64 ounces water daily.    Weight /BMI 02/03/2021 11/25/2020 07/04/2019  WEIGHT 217 lb 212 lb 199 lb  HEIGHT 5\' 6"  5\' 6"  5\' 6"   BMI 35.02 kg/m2 34.22 kg/m2 32.12 kg/m2      Snoring Excessive snoring and fatigue, refer for pulmonary eval for OSA

## 2021-02-03 NOTE — Assessment & Plan Note (Signed)
6 month h/o worsening and uncontrolled symptoms, unable to lie flat, start omeprazole 40 mg and refer to GI

## 2021-02-03 NOTE — Assessment & Plan Note (Signed)
Excessive snoring and fatigue, refer for pulmonary eval for OSA

## 2021-02-03 NOTE — Assessment & Plan Note (Signed)
Deteriorated  Patient re-educated about  the importance of commitment to a  minimum of 150 minutes of exercise per week as able.  The importance of healthy food choices with portion control discussed, as well as eating regularly and within a 12 hour window most days. The need to choose "clean , green" food 50 to 75% of the time is discussed, as well as to make water the primary drink and set a goal of 64 ounces water daily.    Weight /BMI 02/03/2021 11/25/2020 07/04/2019  WEIGHT 217 lb 212 lb 199 lb  HEIGHT 5\' 6"  5\' 6"  5\' 6"   BMI 35.02 kg/m2 34.22 kg/m2 32.12 kg/m2

## 2021-02-03 NOTE — Assessment & Plan Note (Signed)
Progressive solid and liquid dysphagia x 3 months with regurgitation of both solid and liquid , needs GI eval asap

## 2021-02-04 ENCOUNTER — Encounter (INDEPENDENT_AMBULATORY_CARE_PROVIDER_SITE_OTHER): Payer: Self-pay | Admitting: *Deleted

## 2021-03-12 ENCOUNTER — Encounter (INDEPENDENT_AMBULATORY_CARE_PROVIDER_SITE_OTHER): Payer: Self-pay | Admitting: Gastroenterology

## 2021-03-12 ENCOUNTER — Ambulatory Visit (INDEPENDENT_AMBULATORY_CARE_PROVIDER_SITE_OTHER): Payer: 59 | Admitting: Gastroenterology

## 2021-03-12 ENCOUNTER — Encounter (INDEPENDENT_AMBULATORY_CARE_PROVIDER_SITE_OTHER): Payer: Self-pay

## 2021-03-12 ENCOUNTER — Other Ambulatory Visit (INDEPENDENT_AMBULATORY_CARE_PROVIDER_SITE_OTHER): Payer: Self-pay

## 2021-03-12 ENCOUNTER — Other Ambulatory Visit: Payer: Self-pay

## 2021-03-12 VITALS — BP 110/69 | HR 76 | Temp 98.1°F | Ht 66.0 in | Wt 208.2 lb

## 2021-03-12 DIAGNOSIS — K581 Irritable bowel syndrome with constipation: Secondary | ICD-10-CM

## 2021-03-12 DIAGNOSIS — R1319 Other dysphagia: Secondary | ICD-10-CM | POA: Diagnosis not present

## 2021-03-12 DIAGNOSIS — R112 Nausea with vomiting, unspecified: Secondary | ICD-10-CM | POA: Insufficient documentation

## 2021-03-12 DIAGNOSIS — K589 Irritable bowel syndrome without diarrhea: Secondary | ICD-10-CM | POA: Insufficient documentation

## 2021-03-12 MED ORDER — LUBIPROSTONE 24 MCG PO CAPS: 24.0000 ug | ORAL_CAPSULE | Freq: Two times a day (BID) | ORAL | 3 refills | Status: AC

## 2021-03-12 NOTE — Progress Notes (Signed)
Maylon Peppers, M.D. Gastroenterology & Hepatology [facility] [address] [place], [place] [address] Primary Care Physician: Fayrene Helper, MD [address], [place] [address]  Referring MD: PCP  Chief Complaint:  Vomiting and constipation  History of Present Illness: [name] is a 41 y.o. female with past medical history of anxiety and GERD, who presents for evaluation of vomiting and constipation.  Patient reports that she has presented intermittent episodes of vomiting for the last 2 months. She states that she has been vomiting at least 5 times a week. There is not specific time of the day when she has the vomiting episode and there is no clear trigger. She reports her vomit mostly has food contents. No hematemesis or coffee-ground emesis. Sometimes can have some abdominal pain when she has multiple episodes of vomiting. She reports that she has also presented episodes of sharp pain in her abdomen in the past, but his is different from the pain she has when vomiting. She states that she was prescribed omeprazole 40 mg qday by her PCP which has mildly improved her vomiting although she is still presenting these episodes frequently.  Notably, she reports that occasionally she has some episodes of choking when swallowing food but denies having any heartburn or odynophagia.  She has also presented worsening constipation, which she has had for multiple years (as far as she can remember). Patient was taking magnesium citrate in the past as it was the only OTC medication that helped, as she was able to have a BM every 2 weeks. She was prescribed Amitiza 8 mcg a month ago, which she is taking twice day. She is moving her bowels almost every day, but sometimes can last a few days without a bowel movement. Also reports significant bloating.  The patient denies having any fever, chills, hematochezia, melena, hematemesis,   diarrhea, jaundice, pruritus or weight loss. Actually has had problems losing weight.  Last [id number] Small sliding hiatal hernia with serrated GE junction. Biopsies taken to rule out short segment Barrett's. Erosive antral gastritis. Biopsy taken for routine histology.  Path: 1. Stomach, biopsy - CHRONIC FOCALLY ACTIVE GASTRITIS. - THERE IS NO EVIDENCE OF HELICOBACTER PYLORI, GOBLET CELL METAPLASIA, DYSPLASIA OR MALIGNANCY.  2. Esophagus, biopsy - GASTROESOPHAGEAL JUNCTION MUCOSA WITH MILD INFLAMMATION CONSISTENT WITH GASTROESOPHAGEAL REFLUX. - THERE IS NO EVIDENCE OF GOBLET CELL METAPLASIA, DYSPLASIA OR MALIGNANCY.  Last Colonoscopy:never  FHx: neg for any gastrointestinal/liver disease, no malignancies Social: neg smoking, alcohol or illicit drug use Surgical: partial hysterectomy  Past Medical History: Past Medical History:  Diagnosis Date   Anxiety    Hoarseness, chronic 03/12/2013   No pertinent past medical history     Past Surgical History: Past Surgical History:  Procedure Laterality Date   ABDOMINAL HYSTERECTOMY  2013   CESAREAN SECTION  2012   twins   ESOPHAGOGASTRODUODENOSCOPY N/A 05/02/2013   Procedure: ESOPHAGOGASTRODUODENOSCOPY (EGD);  Surgeon: Rogene Houston, MD;  [facility];  Service: Endoscopy;  Laterality: N/A;  [place]  12/13/2011   Procedure: LAPAROSCOPIC ASSISTED VAGINAL HYSTERECTOMY;  Surgeon: Luz Lex, MD;  Location: [facility];  Service: Gynecology;  Laterality: N/A;   TONSILLECTOMY     WISDOM TOOTH EXTRACTION      Family History: Family History  Problem Relation Age of Onset   Hypertension Mother    Hyperlipidemia Mother    Allergies Mother    Hypertension Father  Social History: Social History   Tobacco Use  Smoking Status Never  Smokeless Tobacco Never   Social History   Substance and Sexual Activity  Alcohol Use No   Alcohol/week: 0.0 standard drinks   Social History    Substance and Sexual Activity  Drug Use No    Allergies: No Known Allergies  Medications: Current Outpatient Medications  Medication Sig Dispense Refill   calcium carbonate (TUMS - DOSED IN MG ELEMENTAL CALCIUM) 500 MG chewable tablet Chew 1 tablet by mouth as needed for indigestion or heartburn.     lubiprostone (AMITIZA) 8 MCG capsule Take 1 capsule (8 mcg total) by mouth 2 (two) times daily with a meal. 60 capsule 3   omeprazole (PRILOSEC) 40 MG capsule Take 1 capsule (40 mg total) by mouth daily. 30 capsule 2   No current facility-administered medications for this visit.    Review of Systems: GENERAL: negative for malaise, night sweats HEENT: No changes in hearing or vision, no nose bleeds or other nasal problems. NECK: Negative for lumps, goiter, pain and significant neck swelling RESPIRATORY: Negative for cough, wheezing CARDIOVASCULAR: Negative for chest pain, leg swelling, palpitations, orthopnea GI: SEE HPI MUSCULOSKELETAL: Negative for joint pain or swelling, back pain, and muscle pain. SKIN: Negative for lesions, rash PSYCH: Negative for sleep disturbance, mood disorder and recent psychosocial stressors. HEMATOLOGY Negative for prolonged bleeding, bruising easily, and swollen nodes. ENDOCRINE: Negative for cold or heat intolerance, polyuria, polydipsia and goiter. NEURO: negative for tremor, gait imbalance, syncope and seizures. The remainder of the review of systems is noncontributory.   Physical Exam: BP 110/69 (BP Location: Left Arm, Patient Position: Sitting, Cuff Size: Large)    Pulse 76    Temp 98.1 F (36.7 C) (Oral)    Ht 5\' 6"  (1.676 m)    Wt 208 lb 3.2 oz (94.4 kg)    LMP 11/21/2011    BMI 33.60 kg/m  GENERAL: The patient is AO x3, in no acute distress. HEENT: Head is normocephalic and atraumatic. EOMI are intact. Mouth is well hydrated and without lesions. NECK: Supple. No masses LUNGS: Clear to auscultation. No presence of rhonchi/wheezing/rales.  Adequate chest expansion HEART: RRR, normal s1 and s2. ABDOMEN: Soft, nontender, no guarding, no peritoneal signs, and nondistended. BS +. No masses. EXTREMITIES: Without any cyanosis, clubbing, rash, lesions or edema. NEUROLOGIC: AOx3, no focal motor deficit. SKIN: no jaundice, no rashes   Imaging/Labs: as above  I personally reviewed and interpreted the available labs, imaging and endoscopic files.  Impression and Plan: [name] is a 41 y.o. female with past medical history of anxiety and GERD, who presents for evaluation of vomiting and constipation.  The patient has presented recurrent episodes of vomiting without clear trigger.  Had mild improvement with PPI but her symptoms are not classical for reflux.  Due to this, we will evaluate her symptoms further when esophagogastroduodenospy I will consider a potential dilation given her episodes of choking.  Depending on the findings during endoscopy, will consider performing a cross-sectional abdominal imaging.  I also explained to her that chronic constipation could lead to significant nausea and vomiting if severe.  As she has presented some improvement but no complete resolution of her symptoms, we will increase her Amitiza to 24 mcg twice a day.  Finally, we will check celiac serology given her persistent symptoms.  - Schedule EGD with possible dilation - Increase Amitiza to 24 mcg twice a day - Check celiac serology  All questions were answered.  Maylon Peppers, MD Gastroenterology and Hepatology [facility]

## 2021-03-12 NOTE — Patient Instructions (Addendum)
Schedule EGD with possible dilation Increase Amitiza to 24 mcg twice a day Perform blood workup

## 2021-03-12 NOTE — H&P (View-Only) (Signed)
[name], M.D. Gastroenterology & Hepatology [facility] [address] [place], [place] [address] Primary Care Physician: Fayrene Helper, MD [address], [place] [address]  Referring MD: PCP  Chief Complaint:  Vomiting and constipation  History of Present Illness: [name] is a 41 y.o. female with past medical history of anxiety and GERD, who presents for evaluation of vomiting and constipation.  Patient reports that she has presented intermittent episodes of vomiting for the last 2 months. She states that she has been vomiting at least 5 times a week. There is not specific time of the day when she has the vomiting episode and there is no clear trigger. She reports her vomit mostly has food contents. No hematemesis or coffee-ground emesis. Sometimes can have some abdominal pain when she has multiple episodes of vomiting. She reports that she has also presented episodes of sharp pain in her abdomen in the past, but his is different from the pain she has when vomiting. She states that she was prescribed omeprazole 40 mg qday by her PCP which has mildly improved her vomiting although she is still presenting these episodes frequently.  Notably, she reports that occasionally she has some episodes of choking when swallowing food but denies having any heartburn or odynophagia.  She has also presented worsening constipation, which she has had for multiple years (as far as she can remember). Patient was taking magnesium citrate in the past as it was the only OTC medication that helped, as she was able to have a BM every 2 weeks. She was prescribed Amitiza 8 mcg a month ago, which she is taking twice day. She is moving her bowels almost every day, but sometimes can last a few days without a bowel movement. Also reports significant bloating.  The patient denies having any fever, chills, hematochezia, melena, hematemesis,   diarrhea, jaundice, pruritus or weight loss. Actually has had problems losing weight.  Last [id number] Small sliding hiatal hernia with serrated GE junction. Biopsies taken to rule out short segment Barrett's. Erosive antral gastritis. Biopsy taken for routine histology.  Path: 1. Stomach, biopsy - CHRONIC FOCALLY ACTIVE GASTRITIS. - THERE IS NO EVIDENCE OF HELICOBACTER PYLORI, GOBLET CELL METAPLASIA, DYSPLASIA OR MALIGNANCY.  2. Esophagus, biopsy - GASTROESOPHAGEAL JUNCTION MUCOSA WITH MILD INFLAMMATION CONSISTENT WITH GASTROESOPHAGEAL REFLUX. - THERE IS NO EVIDENCE OF GOBLET CELL METAPLASIA, DYSPLASIA OR MALIGNANCY.  Last Colonoscopy:never  FHx: neg for any gastrointestinal/liver disease, no malignancies Social: neg smoking, alcohol or illicit drug use Surgical: partial hysterectomy  Past Medical History: Past Medical History:  Diagnosis Date   Anxiety    Hoarseness, chronic 03/12/2013   No pertinent past medical history     Past Surgical History: Past Surgical History:  Procedure Laterality Date   ABDOMINAL HYSTERECTOMY  2013   CESAREAN SECTION  2012   twins   ESOPHAGOGASTRODUODENOSCOPY N/A 05/02/2013   Procedure: ESOPHAGOGASTRODUODENOSCOPY (EGD);  Surgeon: Rogene Houston, MD;  [facility];  Service: Endoscopy;  Laterality: N/A;  [place]  12/13/2011   Procedure: LAPAROSCOPIC ASSISTED VAGINAL HYSTERECTOMY;  Surgeon: Luz Lex, MD;  Location: [facility];  Service: Gynecology;  Laterality: N/A;   TONSILLECTOMY     WISDOM TOOTH EXTRACTION      Family History: Family History  Problem Relation Age of Onset   Hypertension Mother    Hyperlipidemia Mother    Allergies Mother    Hypertension Father  Social History: Social History   Tobacco Use  Smoking Status Never  Smokeless Tobacco Never   Social History   Substance and Sexual Activity  Alcohol Use No   Alcohol/week: 0.0 standard drinks   Social History    Substance and Sexual Activity  Drug Use No    Allergies: No Known Allergies  Medications: Current Outpatient Medications  Medication Sig Dispense Refill   calcium carbonate (TUMS - DOSED IN MG ELEMENTAL CALCIUM) 500 MG chewable tablet Chew 1 tablet by mouth as needed for indigestion or heartburn.     lubiprostone (AMITIZA) 8 MCG capsule Take 1 capsule (8 mcg total) by mouth 2 (two) times daily with a meal. 60 capsule 3   omeprazole (PRILOSEC) 40 MG capsule Take 1 capsule (40 mg total) by mouth daily. 30 capsule 2   No current facility-administered medications for this visit.    Review of Systems: GENERAL: negative for malaise, night sweats HEENT: No changes in hearing or vision, no nose bleeds or other nasal problems. NECK: Negative for lumps, goiter, pain and significant neck swelling RESPIRATORY: Negative for cough, wheezing CARDIOVASCULAR: Negative for chest pain, leg swelling, palpitations, orthopnea GI: SEE HPI MUSCULOSKELETAL: Negative for joint pain or swelling, back pain, and muscle pain. SKIN: Negative for lesions, rash PSYCH: Negative for sleep disturbance, mood disorder and recent psychosocial stressors. HEMATOLOGY Negative for prolonged bleeding, bruising easily, and swollen nodes. ENDOCRINE: Negative for cold or heat intolerance, polyuria, polydipsia and goiter. NEURO: negative for tremor, gait imbalance, syncope and seizures. The remainder of the review of systems is noncontributory.   Physical Exam: BP 110/69 (BP Location: Left Arm, Patient Position: Sitting, Cuff Size: Large)    Pulse 76    Temp 98.1 F (36.7 C) (Oral)    Ht 5\' 6"  (1.676 m)    Wt 208 lb 3.2 oz (94.4 kg)    LMP 11/21/2011    BMI 33.60 kg/m  GENERAL: The patient is AO x3, in no acute distress. HEENT: Head is normocephalic and atraumatic. EOMI are intact. Mouth is well hydrated and without lesions. NECK: Supple. No masses LUNGS: Clear to auscultation. No presence of rhonchi/wheezing/rales.  Adequate chest expansion HEART: RRR, normal s1 and s2. ABDOMEN: Soft, nontender, no guarding, no peritoneal signs, and nondistended. BS +. No masses. EXTREMITIES: Without any cyanosis, clubbing, rash, lesions or edema. NEUROLOGIC: AOx3, no focal motor deficit. SKIN: no jaundice, no rashes   Imaging/Labs: as above  I personally reviewed and interpreted the available labs, imaging and endoscopic files.  Impression and Plan: TIMOTHY TOWNSEL is a 41 y.o. female with past medical history of anxiety and GERD, who presents for evaluation of vomiting and constipation.  The patient has presented recurrent episodes of vomiting without clear trigger.  Had mild improvement with PPI but her symptoms are not classical for reflux.  Due to this, we will evaluate her symptoms further when esophagogastroduodenospy I will consider a potential dilation given her episodes of choking.  Depending on the findings during endoscopy, will consider performing a cross-sectional abdominal imaging.  I also explained to her that chronic constipation could lead to significant nausea and vomiting if severe.  As she has presented some improvement but no complete resolution of her symptoms, we will increase her Amitiza to 24 mcg twice a day.  Finally, we will check celiac serology given her persistent symptoms.  - Schedule EGD with possible dilation - Increase Amitiza to 24 mcg twice a day - Check celiac serology  All questions were answered.  Cindy Peppers, MD Gastroenterology and Hepatology [facility]

## 2021-03-13 LAB — CELIAC DISEASE PANEL
(tTG) Ab, IgA: <1 U/mL
(tTG) Ab, IgG: <1 U/mL
Gliadin IgA: <1 U/mL
Gliadin IgG: <1 U/mL
Immunoglobulin A: 269 mg/dL (ref 47–310)

## 2021-03-23 ENCOUNTER — Ambulatory Visit (INDEPENDENT_AMBULATORY_CARE_PROVIDER_SITE_OTHER): Payer: 59 | Admitting: Gastroenterology

## 2021-04-01 ENCOUNTER — Ambulatory Visit (HOSPITAL_BASED_OUTPATIENT_CLINIC_OR_DEPARTMENT_OTHER): Payer: 59 | Admitting: Anesthesiology

## 2021-04-01 ENCOUNTER — Ambulatory Visit (HOSPITAL_COMMUNITY)
Admission: RE | Admit: 2021-04-01 | Discharge: 2021-04-01 | Disposition: A | Discharge status: Home / Self Care | Payer: 59 | Attending: Gastroenterology | Admitting: Gastroenterology

## 2021-04-01 ENCOUNTER — Other Ambulatory Visit: Payer: Self-pay

## 2021-04-01 ENCOUNTER — Ambulatory Visit (HOSPITAL_COMMUNITY): Payer: 59 | Admitting: Anesthesiology

## 2021-04-01 ENCOUNTER — Encounter (HOSPITAL_COMMUNITY)
Admission: RE | Disposition: A | Discharge status: Home / Self Care | Payer: Self-pay | Source: Home / Self Care | Attending: Gastroenterology

## 2021-04-01 ENCOUNTER — Encounter (HOSPITAL_COMMUNITY): Payer: Self-pay | Admitting: Gastroenterology

## 2021-04-01 DIAGNOSIS — M1990 Unspecified osteoarthritis, unspecified site: Secondary | ICD-10-CM | POA: Diagnosis not present

## 2021-04-01 DIAGNOSIS — R112 Nausea with vomiting, unspecified: Secondary | ICD-10-CM | POA: Insufficient documentation

## 2021-04-01 DIAGNOSIS — K2289 Other specified disease of esophagus: Secondary | ICD-10-CM

## 2021-04-01 DIAGNOSIS — D72820 Lymphocytosis (symptomatic): Secondary | ICD-10-CM | POA: Insufficient documentation

## 2021-04-01 DIAGNOSIS — K219 Gastro-esophageal reflux disease without esophagitis: Secondary | ICD-10-CM | POA: Insufficient documentation

## 2021-04-01 DIAGNOSIS — K3189 Other diseases of stomach and duodenum: Secondary | ICD-10-CM

## 2021-04-01 DIAGNOSIS — K5909 Other constipation: Secondary | ICD-10-CM | POA: Diagnosis present

## 2021-04-01 DIAGNOSIS — R1310 Dysphagia, unspecified: Secondary | ICD-10-CM | POA: Diagnosis not present

## 2021-04-01 DIAGNOSIS — K2970 Gastritis, unspecified, without bleeding: Secondary | ICD-10-CM

## 2021-04-01 DIAGNOSIS — K449 Diaphragmatic hernia without obstruction or gangrene: Secondary | ICD-10-CM

## 2021-04-01 DIAGNOSIS — K2950 Unspecified chronic gastritis without bleeding: Secondary | ICD-10-CM | POA: Insufficient documentation

## 2021-04-01 HISTORY — PX: BIOPSY: SHX5522

## 2021-04-01 HISTORY — PX: SAVORY DILATION: SHX5439

## 2021-04-01 HISTORY — PX: ESOPHAGOGASTRODUODENOSCOPY (EGD) WITH PROPOFOL: SHX5813

## 2021-04-01 SURGERY — ESOPHAGOGASTRODUODENOSCOPY (EGD) WITH PROPOFOL
Anesthesia: General

## 2021-04-01 MED ORDER — PROPOFOL 500 MG/50ML IV EMUL
INTRAVENOUS | Status: DC | PRN
  Administered 2021-04-01: 150 ug/kg/min via INTRAVENOUS

## 2021-04-01 MED ORDER — PROPOFOL 10 MG/ML IV BOLUS
INTRAVENOUS | Status: DC | PRN
  Administered 2021-04-01 (×4): 50 mg via INTRAVENOUS
  Administered 2021-04-01: 100 mg via INTRAVENOUS

## 2021-04-01 MED ORDER — LIDOCAINE HCL (CARDIAC) PF 100 MG/5ML IV SOSY
PREFILLED_SYRINGE | INTRAVENOUS | Status: DC | PRN
  Administered 2021-04-01: 50 mg via INTRAVENOUS

## 2021-04-01 MED ORDER — DEXMEDETOMIDINE (PRECEDEX) IN NS 20 MCG/5ML (4 MCG/ML) IV SYRINGE
PREFILLED_SYRINGE | INTRAVENOUS | Status: AC
  Filled 2021-04-01: qty 5

## 2021-04-01 MED ORDER — DEXMEDETOMIDINE (PRECEDEX) IN NS 20 MCG/5ML (4 MCG/ML) IV SYRINGE
PREFILLED_SYRINGE | INTRAVENOUS | Status: DC | PRN
  Administered 2021-04-01: 20 ug via INTRAVENOUS

## 2021-04-01 MED ORDER — LACTATED RINGERS IV SOLN
INTRAVENOUS | Status: DC
  Administered 2021-04-01: 1000 mL via INTRAVENOUS

## 2021-04-01 NOTE — Interval H&P Note (Signed)
History and Physical Interval Note: ? ?04/01/2021 ?1:35 PM ? ?[name]  has presented today for surgery, with the diagnosis of Nausea Vomting dysphagia.  The various methods of treatment have been discussed with the patient and family. After consideration of risks, benefits and other options for treatment, the patient has consented to  Procedure(s) with comments: ?ESOPHAGOGASTRODUODENOSCOPY (EGD) WITH PROPOFOL (N/A) - 245 as a surgical intervention.  The patient's history has been reviewed, patient examined, no change in status, stable for surgery.  I have reviewed the patient's chart and labs.  Questions were answered to the patient's satisfaction.   ? ? ?Maylon Peppers Mayorga ? ? ?

## 2021-04-01 NOTE — Anesthesia Procedure Notes (Signed)
Date/Time: 04/01/2021 1:35 PM ?Performed by: Orlie Dakin, CRNA ?Pre-anesthesia Checklist: Patient identified, Emergency Drugs available, Suction available and Patient being monitored ?Patient Re-evaluated:Patient Re-evaluated prior to induction ?Oxygen Delivery Method: Nasal cannula ?Induction Type: IV induction ?Placement Confirmation: positive ETCO2 ? ? ? ? ?

## 2021-04-01 NOTE — Anesthesia Preprocedure Evaluation (Addendum)
Anesthesia Evaluation  ?Patient identified by MRN, date of birth, ID band ?Patient awake ? ? ? ?Reviewed: ?Allergy & Precautions, NPO status , Patient's Chart, lab work & pertinent test results ? ?Airway ?Mallampati: III ? ?TM Distance: >3 FB ?Neck ROM: Full ? ? ? Dental ? ?(+) Dental Advisory Given,  ?  ?Pulmonary ?neg pulmonary ROS,  ?  ?Pulmonary exam normal ?breath sounds clear to auscultation ? ? ? ? ? ? Cardiovascular ?negative cardio ROS ?Normal cardiovascular exam ?Rhythm:Regular Rate:Normal ? ? ?  ?Neuro/Psych ?PSYCHIATRIC DISORDERS Anxiety negative neurological ROS ?   ? GI/Hepatic ?Neg liver ROS, GERD  Medicated and Controlled,  ?Endo/Other  ?negative endocrine ROS ? Renal/GU ?negative Renal ROS  ?negative genitourinary ?  ?Musculoskeletal ? ?(+) Arthritis ,  ? Abdominal ?  ?Peds ?negative pediatric ROS ?(+)  Hematology ?negative hematology ROS ?(+)   ?Anesthesia Other Findings ? ? Reproductive/Obstetrics ?negative OB ROS ? ?  ? ? ? ? ? ? ? ? ? ? ? ? ? ?  ?  ? ? ? ? ? ? ? ?Anesthesia Physical ?Anesthesia Plan ? ?ASA: 2 ? ?Anesthesia Plan: General  ? ?Post-op Pain Management: Minimal or no pain anticipated  ? ?Induction: Intravenous ? ?PONV Risk Score and Plan: TIVA ? ?Airway Management Planned: Nasal Cannula and Natural Airway ? ?Additional Equipment:  ? ?Intra-op Plan:  ? ?Post-operative Plan:  ? ?Informed Consent: I have reviewed the patients History and Physical, chart, labs and discussed the procedure including the risks, benefits and alternatives for the proposed anesthesia with the patient or authorized representative who has indicated his/her understanding and acceptance.  ? ? ? ?Dental advisory given ? ?Plan Discussed with: CRNA and Surgeon ? ?Anesthesia Plan Comments:   ? ? ? ? ? ? ?Anesthesia Quick Evaluation ? ?

## 2021-04-01 NOTE — Transfer of Care (Signed)
Immediate Anesthesia Transfer of Care Note ? ?Patient: [name] ? ?Procedure(s) Performed: ESOPHAGOGASTRODUODENOSCOPY (EGD) WITH PROPOFOL ?BIOPSY ?SAVORY DILATION ? ?Patient Location: Endoscopy Unit ? ?Anesthesia Type:General ? ?Level of Consciousness: drowsy ? ?Airway & Oxygen Therapy: Patient Spontanous Breathing ? ?Post-op Assessment: Report given to RN and Post -op Vital signs reviewed and stable ? ?Post vital signs: Reviewed and stable ? ?Last Vitals:  ?Vitals Value Taken Time  ?BP 81/42 04/01/21 1359  ?Temp 36.9 ?C 04/01/21 1359  ?Pulse 82 04/01/21 1359  ?Resp 20 04/01/21 1359  ?SpO2 97 % 04/01/21 1359  ? ? ?Last Pain:  ?Vitals:  ? 04/01/21 1359  ?TempSrc: Axillary  ?PainSc:   ?   ? ?Patients Stated Pain Goal: 8 (04/01/21 1318) ? ?Complications: No notable events documented. ?

## 2021-04-01 NOTE — Op Note (Signed)
[facility] ?Patient Name: [name] ?Procedure Date: 04/01/2021 1:22 PM ?MRN: [id number] ?Date of Birth: Jun 14, 1980 ?Attending MD: Maylon Peppers ,  ?CSN: [id number] ?Age: 41 ?Admit Type: Outpatient ?Procedure:                Upper GI endoscopy ?Indications:              Dysphagia, Nausea with vomiting ?Providers:                Maylon Peppers, Hughie Closs RN, RN, Tammy  ?                          Vaught, RN, Aram Candela ?Referring MD:              ?Medicines:                Monitored Anesthesia Care ?Complications:            No immediate complications. ?Estimated Blood Loss:     Estimated blood loss: none. ?Procedure:                Pre-Anesthesia Assessment: ?                          - Prior to the procedure, a History and Physical  ?                          was performed, and patient medications, allergies  ?                          and sensitivities were reviewed. The patient's  ?                          tolerance of previous anesthesia was reviewed. ?                          - The risks and benefits of the procedure and the  ?                          sedation options and risks were discussed with the  ?                          patient. All questions were answered and informed  ?                          consent was obtained. ?                          - ASA Grade Assessment: II - A patient with mild  ?                          systemic disease. ?                          After obtaining informed consent, the endoscope was  ?                          passed under direct vision. Throughout the  ?  procedure, the patient's blood pressure, pulse, and  ?                          oxygen saturations were monitored continuously. The  ?                          GIF-H190 ([id number]) scope was introduced through the  ?                          mouth, and advanced to the second part of duodenum.  ?                          The upper GI endoscopy was accomplished without  ?                           difficulty. The patient tolerated the procedure  ?                          well. ?Scope In: 1:37:31 PM ?Scope Out: 1:56:49 PM ?Total Procedure Duration: 0 hours 19 minutes 18 seconds  ?Findings: ?     No endoscopic abnormality was evident in the esophagus to explain the  ?     patient's complaint of dysphagia. It was decided, however, to proceed  ?     with dilation of the entire esophagus. A guidewire was placed and the  ?     scope was withdrawn. Dilation was performed with a Savary dilator with  ?     no resistance at 18 mm. Upon careful inspection, no presence of hematin  ?     or active bleeding was present. Biopsies were obtained from the proximal  ?     and distal esophagus with cold forceps for histology of suspected  ?     eosinophilic esophagitis. ?     The Z-line was irregular and was found 36 cm from the incisors. Biopsies  ?     were taken with a cold forceps for histology. ?     A 1 cm hiatal hernia was present. ?     Localized mildly erythematous mucosa was found in the gastric antrum.  ?     Biopsies were taken with a cold forceps for Helicobacter pylori testing. ?     The examined duodenum was normal. Biopsies were taken with a cold  ?     forceps for histology. ?Impression:               - No endoscopic esophageal abnormality to explain  ?                          patient's dysphagia. Esophagus dilated. Dilated.  ?                          Biopsied. ?                          - Z-line irregular, 36 cm from the incisors.  ?                          Biopsied. ?                          -  1 cm hiatal hernia. ?                          - Erythematous mucosa in the antrum. Biopsied. ?                          - Normal examined duodenum. Biopsied. ?Moderate Sedation: ?     Per Anesthesia Care ?Recommendation:           - Discharge patient to home (ambulatory). ?                          - Resume previous diet. ?                          - Await pathology results. ?                           - Continue present medications. ?Procedure Code(s):        --- Professional --- ?                          [id number], Esophagogastroduodenoscopy, flexible,  ?                          transoral; with insertion of guide wire followed by  ?                          passage of dilator(s) through esophagus over guide  ?                          wire ?                          43239, 59, Esophagogastroduodenoscopy, flexible,  ?                          transoral; with biopsy, single or multiple ?Diagnosis Code(s):        --- Professional --- ?                          R13.10, Dysphagia, unspecified ?                          K22.8, Other specified diseases of esophagus ?                          K44.9, Diaphragmatic hernia without obstruction or  ?                          gangrene ?                          K31.89, Other diseases of stomach and duodenum ?                          R11.2, Nausea with vomiting, unspecified ?CPT copyright 2019 American Medical Association. All rights reserved. ?The codes documented in this report are  preliminary and upon coder review may  ?be revised to meet current compliance requirements. ?Maylon Peppers, MD ?Maylon Peppers,  ?04/01/2021 2:01:59 PM ?This report has been signed electronically. ?Number of Addenda: 0 ?

## 2021-04-01 NOTE — Discharge Instructions (Signed)
You are being discharged to home.  ?Resume your previous diet.  ?We are waiting for your pathology results.  ?Continue your present medications.  ?

## 2021-04-01 NOTE — Anesthesia Postprocedure Evaluation (Signed)
Anesthesia Post Note ? ?Patient: [name] ? ?Procedure(s) Performed: ESOPHAGOGASTRODUODENOSCOPY (EGD) WITH PROPOFOL ?BIOPSY ?SAVORY DILATION ? ?Patient location during evaluation: Endoscopy ?Anesthesia Type: General ?Level of consciousness: awake and alert and oriented ?Pain management: pain level controlled ?Vital Signs Assessment: post-procedure vital signs reviewed and stable ?Respiratory status: spontaneous breathing, nonlabored ventilation and respiratory function stable ?Cardiovascular status: blood pressure returned to baseline and stable ?Postop Assessment: no apparent nausea or vomiting ?Anesthetic complications: no ? ? ?No notable events documented. ? ? ?Last Vitals:  ?Vitals:  ? 04/01/21 1359 04/01/21 1403  ?BP: (!) 81/42 (!) 103/49  ?Pulse: 82 88  ?Resp: 20 20  ?Temp: 36.9 ?C   ?SpO2: 97% 97%  ?  ?Last Pain:  ?Vitals:  ? 04/01/21 1403  ?TempSrc:   ?PainSc: 0-No pain  ? ? ?  ?  ?  ?  ?  ?  ? ?Arianni Gallego C Kalan Yeley ? ? ? ? ?

## 2021-04-03 ENCOUNTER — Other Ambulatory Visit: Payer: Self-pay

## 2021-04-03 ENCOUNTER — Ambulatory Visit (INDEPENDENT_AMBULATORY_CARE_PROVIDER_SITE_OTHER): Payer: 59 | Admitting: Pulmonary Disease

## 2021-04-03 ENCOUNTER — Encounter: Payer: Self-pay | Admitting: Pulmonary Disease

## 2021-04-03 VITALS — BP 128/78 | HR 74 | Temp 98.0°F | Ht 66.0 in | Wt 208.4 lb

## 2021-04-03 DIAGNOSIS — R0683 Snoring: Secondary | ICD-10-CM

## 2021-04-03 LAB — SURGICAL PATHOLOGY

## 2021-04-03 NOTE — Progress Notes (Signed)
? ? Pulmonary, Critical Care, and Sleep Medicine ? ?Chief Complaint  ?Patient presents with  ? Consult  ?  Ref by Dr. Moshe Cipro for daytime fatigue and snoring   ? ? ?Past Surgical History:  ?She  has a past surgical history that includes Tonsillectomy; Wisdom tooth extraction; Laparoscopic assisted vaginal hysterectomy (12/13/2011); Abdominal hysterectomy (2013); Cesarean section (2012); and Esophagogastroduodenoscopy (N/A, 05/02/2013). ? ?Past Medical History:  ?Anxiety, Hoarseness, Reflux ? ?Constitutional:  ?BP 128/78 (BP Location: Left Arm, Patient Position: Sitting)   Pulse 74   Temp 98 ?F (36.7 ?C) (Temporal)   Ht 5\' 6"  (1.676 m)   Wt 208 lb 6.4 oz (94.5 kg)   LMP 11/21/2011   SpO2 99% Comment: ra  BMI 33.64 kg/m?  ? ?Brief Summary:  ?[name] is a 41 y.o. female with snoring. ?  ? ? ? ?Subjective:  ? ?She was seen by her PCP in January.  She was noted to have snoring and daytime fatigue.  This has been going on for years.  She will wake up hearing herself snore and with a cough.  She feels her throat is closing.  She is a restless sleeper.  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  She falls asleep at home in the evening when watching TV or reading a book. ? ?She goes to sleep between 9 and 1030 pm pm.  She falls asleep in 20 minutes.  She wakes up 1 or 2 times to use the bathroom.  She sometimes has trouble falling back to sleep.  She gets out of bed at 530 am.  She feels run down in the morning.  She denies morning headache.  She has used OTC sleep aides before, but not recently.  She uses lavender oil, and this helps her relax.  She drinks tea during the day.  She gets cramps her legs frequently at night. ? ?She denies sleep walking, sleep talking, bruxism, or nightmares.  There is no history of restless legs.  She denies sleep hallucinations, sleep paralysis, or cataplexy. ? ?The Epworth score is 10 out of 24. ? ? ?Physical Exam:  ? ?Appearance - well kempt   ? ?ENMT - no sinus tenderness, no oral exudate, no LAN, Mallampati 4 airway, no stridor, enlarged tongue ? ?Respiratory - equal breath sounds bilaterally, no wheezing or rales ? ?CV - s1s2 regular rate and rhythm, no murmurs ? ?Ext - no clubbing, no edema ? ?Skin - no rashes ? ?Psych - normal mood and affect ?  ?Sleep Tests:  ? ? ?Social History:  ?She  reports that she has never smoked. She has never used smokeless tobacco. She reports that she does not drink alcohol and does not use drugs. ? ?Family History:  ?Her family history includes Allergies in her mother; Hyperlipidemia in her mother; Hypertension in her father and mother. ?  ? ?Discussion:  ?She has snoring, sleep disruption, apnea, nocturnal leg cramps and daytime sleepiness.  I am concerned she could have obstructive sleep apnea. ? ?Assessment/Plan:  ? ?Snoring with excessive daytime sleepiness. ?- will need to arrange for a home sleep study ? ?Obesity. ?- discussed how weight can impact sleep and risk for sleep disordered breathing ?- discussed options to assist with weight loss: combination of diet modification, cardiovascular and strength training exercises ? ?Cardiovascular risk. ?- had an extensive discussion regarding the adverse health consequences related to untreated sleep disordered breathing ?- specifically discussed the risks for hypertension,  coronary artery disease, cardiac dysrhythmias, cerebrovascular disease, and diabetes ?- lifestyle modification discussed ? ?Safe driving practices. ?- discussed how sleep disruption can increase risk of accidents, particularly when driving ?- safe driving practices were discussed ? ?Therapies for obstructive sleep apnea. ?- if the sleep study shows significant sleep apnea, then various therapies for treatment were reviewed: CPAP, oral appliance, and surgical interventions ? ?Time Spent Involved in Patient Care on Day of Examination:  ?35 minutes ? ?Follow up:  ? ?Patient Instructions  ?Will arrange for  home sleep study ?Will call to arrange for follow up after sleep study reviewed ? ? ?Medication List:  ? ?Allergies as of 04/03/2021   ?No Known Allergies ?  ? ?  ?Medication List  ?  ? ?  ? Accurate as of April 03, 2021  9:15 AM. If you have any questions, ask your nurse or doctor.  ?  ?  ? ?  ? ?lubiprostone 24 MCG capsule ?Commonly known as: AMITIZA ?Take 1 capsule (24 mcg total) by mouth 2 (two) times daily with a meal. ?  ?omeprazole 40 MG capsule ?Commonly known as: PRILOSEC ?Take 1 capsule (40 mg total) by mouth daily. ?  ? ?  ? ? ?Signature:  ?Chesley Mires, MD ?[facility] ?Pager - [phone] ?04/03/2021, 9:15 AM ?  ? ? ? ? ? ? ? ? ?

## 2021-04-03 NOTE — Patient Instructions (Signed)
Will arrange for home sleep study Will call to arrange for follow up after sleep study reviewed  

## 2021-04-06 ENCOUNTER — Encounter (HOSPITAL_COMMUNITY): Payer: Self-pay | Admitting: Gastroenterology

## 2021-05-04 ENCOUNTER — Ambulatory Visit: Payer: 59 | Admitting: Nutrition

## 2021-05-04 ENCOUNTER — Telehealth: Payer: Self-pay | Admitting: Nutrition

## 2021-05-04 NOTE — Telephone Encounter (Signed)
Left message for her to call and r/s missed appt. ?

## 2021-05-12 ENCOUNTER — Other Ambulatory Visit: Payer: Self-pay | Admitting: Family Medicine

## 2021-05-13 ENCOUNTER — Ambulatory Visit (INDEPENDENT_AMBULATORY_CARE_PROVIDER_SITE_OTHER): Payer: 59 | Admitting: Family Medicine

## 2021-05-13 ENCOUNTER — Encounter: Payer: Self-pay | Admitting: Family Medicine

## 2021-05-13 VITALS — BP 111/73 | HR 61 | Resp 16 | Ht 66.0 in | Wt 206.4 lb

## 2021-05-13 DIAGNOSIS — E669 Obesity, unspecified: Secondary | ICD-10-CM

## 2021-05-13 DIAGNOSIS — R1319 Other dysphagia: Secondary | ICD-10-CM

## 2021-05-13 DIAGNOSIS — K5900 Constipation, unspecified: Secondary | ICD-10-CM

## 2021-05-13 DIAGNOSIS — E66811 Obesity, class 1: Secondary | ICD-10-CM

## 2021-05-13 DIAGNOSIS — Z1231 Encounter for screening mammogram for malignant neoplasm of breast: Secondary | ICD-10-CM

## 2021-05-13 DIAGNOSIS — E559 Vitamin D deficiency, unspecified: Secondary | ICD-10-CM

## 2021-05-13 DIAGNOSIS — R0683 Snoring: Secondary | ICD-10-CM

## 2021-05-13 DIAGNOSIS — Z13220 Encounter for screening for lipoid disorders: Secondary | ICD-10-CM

## 2021-05-13 NOTE — Patient Instructions (Addendum)
Annual exam end October , call iof you need me sooner ? ?THankful MUCH better, stay on meds , and please f/u with sleep study and nutrition ? ?Excellent weight loss , and committing to exercise will only be additionally beneficial ? ?Please schedule October mammogram at checkout ? ?Fasting CBc, lipid, cmp and EGFr, TSH and vit D in October 1 week before next appt for annual exam ? ?It is important that you exercise regularly at least 30 minutes 5 times a week. If you develop chest pain, have severe difficulty breathing, or feel very tired, stop exercising immediately and seek medical attention  ? ? ?Thanks for choosing [facility], we consider it a privelige to serve you. ? ? ? ?

## 2021-05-18 ENCOUNTER — Encounter: Payer: Self-pay | Admitting: Family Medicine

## 2021-05-18 NOTE — Assessment & Plan Note (Signed)
Great response to [place] daily, continue same ?

## 2021-05-18 NOTE — Assessment & Plan Note (Signed)
Improved, which is GREAT ? ?Patient re-educated about  the importance of commitment to a  minimum of 150 minutes of exercise per week as able. ? ?The importance of healthy food choices with portion control discussed, as well as eating regularly and within a 12 hour window most days. ?The need to choose "clean , green" food 50 to 75% of the time is discussed, as well as to make water the primary drink and set a goal of 64 ounces water daily. ? ?  ? ?  05/13/2021  ?  4:10 PM 04/03/2021  ?  8:59 AM 04/01/2021  ?  1:18 PM  ?Weight /BMI  ?Weight 206 lb 6.4 oz 208 lb 6.4 oz 195 lb  ?Height '5\' 6"'$  (1.676 m) '5\' 6"'$  (1.676 m) '5\' 6"'$  (1.676 m)  ?BMI 33.31 kg/m2 33.64 kg/m2 31.47 kg/m2  ? ? ? ?

## 2021-05-18 NOTE — Progress Notes (Signed)
? ?  [name]     MRN: [id number]      DOB: 1980-12-24 ? ? ?HPI ?[name] is here for follow up and re-evaluation of chronic medical conditions, medication management and review of any available recent lab and radiology data.  ?Preventive health is updated, specifically  Cancer screening and Immunization.   ?Abdominal pain and constipation are MUCH improved ?She is working successfully on weigh loss with lifestyle changeand is very happy about this ?Has upcoming dsleep study  ? ?ROS ?Denies recent fever or chills. ?Denies sinus pressure, nasal congestion, ear pain or sore throat. ?Denies chest congestion, productive cough or wheezing. ?Denies chest pains, palpitations and leg swelling ?Denies abdominal pain, nausea, vomiting,diarrhea or constipation.   ?Denies dysuria, frequency, hesitancy or incontinence. ?Denies joint pain, swelling and limitation in mobility. ?Denies headaches, seizures, numbness, or tingling. ?Denies depression, anxiety or insomnia. ?Denies skin break down or rash. ? ? ?PE ? ?BP 111/73   Pulse 61   Resp 16   Ht '5\' 6"'$  (1.676 m)   Wt 206 lb 6.4 oz (93.6 kg)   LMP 11/21/2011   SpO2 96%   BMI 33.31 kg/m?  ? ?Patient alert and oriented and in no cardiopulmonary distress. ? ?HEENT: No facial asymmetry, EOMI,     Neck supple . ? ?Chest: Clear to auscultation bilaterally. ? ?CVS: S1, S2 no murmurs, no S3.Regular rate. ? ?ABD: Soft non tender.  ? ?Ext: No edema ? ?MS: Adequate ROM spine, shoulders, hips and knees. ? ?Skin: Intact, no ulcerations or rash noted. ? ?Psych: Good eye contact, normal affect. Memory intact not anxious or depressed appearing. ? ?CNS: CN 2-12 intact, power,  normal throughout.no focal deficits noted. ? ? ?Assessment & Plan ? ?Dysphagia ?Resolved following upper endoscopy and  On PPI continue same ? ?Obesity (BMI 30.0-34.9) ?Improved, which is GREAT ? ?Patient re-educated about  the importance of commitment to a  minimum of 150 minutes of exercise per week as able. ? ?The  importance of healthy food choices with portion control discussed, as well as eating regularly and within a 12 hour window most days. ?The need to choose "clean , green" food 50 to 75% of the time is discussed, as well as to make water the primary drink and set a goal of 64 ounces water daily. ? ?  ? ?  05/13/2021  ?  4:10 PM 04/03/2021  ?  8:59 AM 04/01/2021  ?  1:18 PM  ?Weight /BMI  ?Weight 206 lb 6.4 oz 208 lb 6.4 oz 195 lb  ?Height '5\' 6"'$  (1.676 m) '5\' 6"'$  (1.676 m) '5\' 6"'$  (1.676 m)  ?BMI 33.31 kg/m2 33.64 kg/m2 31.47 kg/m2  ? ? ? ? ?Snoring ?Still waiting on sleep study ? ?Constipation ?Great response to [place] daily, continue same ? ?

## 2021-05-18 NOTE — Assessment & Plan Note (Signed)
Still waiting on sleep study ?

## 2021-05-18 NOTE — Assessment & Plan Note (Signed)
Resolved following upper endoscopy and  On PPI continue same ?

## 2021-07-13 ENCOUNTER — Ambulatory Visit (INDEPENDENT_AMBULATORY_CARE_PROVIDER_SITE_OTHER): Payer: 59 | Admitting: Gastroenterology

## 2021-07-13 ENCOUNTER — Encounter (INDEPENDENT_AMBULATORY_CARE_PROVIDER_SITE_OTHER): Payer: Self-pay | Admitting: Gastroenterology

## 2021-07-13 VITALS — BP 111/57 | HR 67 | Temp 97.7°F | Ht 66.0 in | Wt 208.5 lb

## 2021-07-13 DIAGNOSIS — K900 Celiac disease: Secondary | ICD-10-CM

## 2021-07-13 DIAGNOSIS — K219 Gastro-esophageal reflux disease without esophagitis: Secondary | ICD-10-CM

## 2021-07-13 DIAGNOSIS — K581 Irritable bowel syndrome with constipation: Secondary | ICD-10-CM | POA: Diagnosis not present

## 2021-07-13 NOTE — Progress Notes (Signed)
Maylon Peppers, M.D. Gastroenterology & Hepatology [facility] [address] [place], [place] [address]  Primary Care Physician: Fayrene Helper, MD [address], [place] [address]  I will communicate my assessment and recommendations to the referring MD via EMR.  Problems: Possible celiac disease IBS-C GERD  History of Present Illness: [name] is a 41 y.o. female with past medical history of IBS-C, possible celiac disease, anxiety and GERD, who presents for follow up of celiac disease and IBS-C.  The patient was last seen on 03/12/2021. At that time, the patient was scheduled for esophagogastroduodenospy and she was advised to increase the Amitiza to 24 mcg twice a day.  Celiac serology was negative.  Esophagogastroduodenospy performed on 04/01/2021 showed normal esophagus which was empirically dilated with a Savary dilator up to 18 mm.  There was a 1 cm hiatal hernia.  There was mild erythema in the gastric antrum and the duodenum was normal.  Pathology of the mid and distal esophagus was negative, gastric biopsies were negative for dysplasia or H. pylori, had presence of mild chronic gastritis.  Duodenal biopsies showed presence of intraepithelial lymphocytosis and mild villous architectural changes suggestive of possible early celiac disease.  She was referred for evaluation with nutrition but she had to reschedule her appointment and has not seen the dietitian yet.  Patient was started on omeprazole 40 mg once every day. She reports that her dysphagia and heartburn are controlled as long as she takes her medicine.  Denies having any complaints with the symptoms so she is taking the medication every day.  She is taking Amitiza 24 BID, which is helping her have a bowel movement every day.   The patient denies having any nausea, vomiting, fever, chills, hematochezia, melena, hematemesis, jaundice, pruritus. Has  lost some weight on purpose.  Stated that she has had some intermittent bloating occasionally.  Last EGD: As above Last Colonoscopy: Never  Past Medical History: Past Medical History:  Diagnosis Date   Anxiety    Hoarseness, chronic 03/12/2013   No pertinent past medical history     Past Surgical History: Past Surgical History:  Procedure Laterality Date   ABDOMINAL HYSTERECTOMY  2013   BIOPSY  04/01/2021   Procedure: BIOPSY;  Surgeon: Harvel Quale, MD;  [facility];  Service: Gastroenterology;;   CESAREAN SECTION  2012   twins   ESOPHAGOGASTRODUODENOSCOPY N/A 05/02/2013   Procedure: ESOPHAGOGASTRODUODENOSCOPY (EGD);  Surgeon: Rogene Houston, MD;  [facility];  Service: Endoscopy;  Laterality: N/A;  230   ESOPHAGOGASTRODUODENOSCOPY (EGD) WITH PROPOFOL N/A 04/01/2021   Procedure: ESOPHAGOGASTRODUODENOSCOPY (EGD) WITH PROPOFOL;  Surgeon: Harvel Quale, MD;  [facility];  Service: Gastroenterology;  Laterality: N/A;  [place]  12/13/2011   Procedure: LAPAROSCOPIC ASSISTED VAGINAL HYSTERECTOMY;  Surgeon: Luz Lex, MD;  Location: [facility];  Service: Gynecology;  Laterality: N/A;   SAVORY DILATION  04/01/2021   Procedure: SAVORY DILATION;  Surgeon: Montez Morita, Quillian Quince, MD;  Location: [facility];  Service: Gastroenterology;;   TONSILLECTOMY     WISDOM TOOTH EXTRACTION      Family History: Family History  Problem Relation Age of Onset   Hypertension Mother    Hyperlipidemia Mother    Allergies Mother    Hypertension Father     Social History: Social History   Tobacco Use  Smoking Status Never  Smokeless Tobacco Never  Social History   Substance and Sexual Activity  Alcohol Use No   Alcohol/week: 0.0 standard drinks of alcohol   Social History   Substance and Sexual Activity  Drug Use No    Allergies: No Known Allergies  Medications: Current Outpatient  Medications  Medication Sig Dispense Refill   lubiprostone (AMITIZA) 24 MCG capsule Take 1 capsule (24 mcg total) by mouth 2 (two) times daily with a meal. 60 capsule 3   omeprazole (PRILOSEC) 40 MG capsule TAKE ONE CAPSULE BY MOUTH DAILY 30 capsule 2   No current facility-administered medications for this visit.    Review of Systems: GENERAL: negative for malaise, night sweats HEENT: No changes in hearing or vision, no nose bleeds or other nasal problems. NECK: Negative for lumps, goiter, pain and significant neck swelling RESPIRATORY: Negative for cough, wheezing CARDIOVASCULAR: Negative for chest pain, leg swelling, palpitations, orthopnea GI: SEE HPI MUSCULOSKELETAL: Negative for joint pain or swelling, back pain, and muscle pain. SKIN: Negative for lesions, rash PSYCH: Negative for sleep disturbance, mood disorder and recent psychosocial stressors. HEMATOLOGY Negative for prolonged bleeding, bruising easily, and swollen nodes. ENDOCRINE: Negative for cold or heat intolerance, polyuria, polydipsia and goiter. NEURO: negative for tremor, gait imbalance, syncope and seizures. The remainder of the review of systems is noncontributory.   Physical Exam: BP (!) 111/57 (BP Location: Left Arm, Patient Position: Sitting, Cuff Size: Large)   Pulse 67   Temp 97.7 F (36.5 C) (Oral)   Ht '5\' 6"'$  (1.676 m)   Wt 208 lb 8 oz (94.6 kg)   LMP 11/21/2011   BMI 33.65 kg/m  GENERAL: The patient is AO x3, in no acute distress. HEENT: Head is normocephalic and atraumatic. EOMI are intact. Mouth is well hydrated and without lesions. NECK: Supple. No masses LUNGS: Clear to auscultation. No presence of rhonchi/wheezing/rales. Adequate chest expansion HEART: RRR, normal s1 and s2. ABDOMEN: Soft, nontender, no guarding, no peritoneal signs, and nondistended. BS +. No masses. EXTREMITIES: Without any cyanosis, clubbing, rash, lesions or edema. NEUROLOGIC: AOx3, no focal motor deficit. SKIN: no  jaundice, no rashes  Imaging/Labs: as above  I personally reviewed and interpreted the available labs, imaging and endoscopic files.  Impression and Plan: [name] is a 41 y.o. female with past medical history of IBS-C, possible celiac disease, anxiety and GERD, who presents for follow up of celiac disease and IBS-C.  Patient has presented improvement of her upper gastrointestinal symptoms with the use of omeprazole 40 mg every day which she will continue taking for now.  She has also presented improvement of her constipation while taking Amitiza which she can continue taking twice a day.  In terms of her possible celiac disease, she may have further improvement of her bloating if she implements gluten-free diet strictly.  I encouraged her to follow with a nutritionist to make sure she adheres to the diet as much as possible and she remains asymptomatic, can consider repeating EGD in 2 years to repeat small bowel biopsies.  - Continue omeprazole 40 mg qday - Continue Amitiza 24 mcg BID - Schedule appointment with nutritionist for celiac disease - Will consider repeat EGD in 2 years if symptom improvement is achieved  All questions were answered.      Harvel Quale, MD Gastroenterology and Hepatology [facility]

## 2021-07-13 NOTE — Patient Instructions (Signed)
Continue omeprazole 40 mg qday Continue Amitiza 24 mcg BID Schedule appointment with nutritionist for celiac disease Will consider repeat EGD in 2 years if symptom improvement is achieved

## 2021-08-12 ENCOUNTER — Other Ambulatory Visit: Payer: Self-pay | Admitting: Family Medicine

## 2021-11-16 ENCOUNTER — Ambulatory Visit (HOSPITAL_COMMUNITY)
Admission: RE | Admit: 2021-11-16 | Discharge: 2021-11-16 | Disposition: A | Discharge status: Home / Self Care | Payer: BC Managed Care – PPO | Source: Ambulatory Visit | Attending: Family Medicine | Admitting: Family Medicine

## 2021-11-16 DIAGNOSIS — Z1231 Encounter for screening mammogram for malignant neoplasm of breast: Secondary | ICD-10-CM | POA: Diagnosis not present

## 2021-11-17 ENCOUNTER — Encounter: Payer: Self-pay | Admitting: Family Medicine

## 2021-11-17 ENCOUNTER — Ambulatory Visit (INDEPENDENT_AMBULATORY_CARE_PROVIDER_SITE_OTHER): Payer: BC Managed Care – PPO | Admitting: Family Medicine

## 2021-11-17 VITALS — BP 124/82 | HR 87 | Ht 66.0 in | Wt 214.0 lb

## 2021-11-17 DIAGNOSIS — Z23 Encounter for immunization: Secondary | ICD-10-CM | POA: Diagnosis not present

## 2021-11-17 DIAGNOSIS — R490 Dysphonia: Secondary | ICD-10-CM | POA: Diagnosis not present

## 2021-11-17 DIAGNOSIS — Z13220 Encounter for screening for lipoid disorders: Secondary | ICD-10-CM

## 2021-11-17 DIAGNOSIS — Z0001 Encounter for general adult medical examination with abnormal findings: Secondary | ICD-10-CM

## 2021-11-17 DIAGNOSIS — E559 Vitamin D deficiency, unspecified: Secondary | ICD-10-CM

## 2021-11-17 DIAGNOSIS — Z0000 Encounter for general adult medical examination without abnormal findings: Secondary | ICD-10-CM

## 2021-11-17 DIAGNOSIS — E669 Obesity, unspecified: Secondary | ICD-10-CM | POA: Diagnosis not present

## 2021-11-17 DIAGNOSIS — E66811 Obesity, class 1: Secondary | ICD-10-CM

## 2021-11-17 NOTE — Assessment & Plan Note (Signed)
Refer eNT for re eval

## 2021-11-17 NOTE — Patient Instructions (Signed)
Follow-up in 6 months call if you need me sooner.  Flu vaccine today.  Work on lifestyle change with  daily exercise for 30 minutes bike riding between 5 and 530 every morning during the week.  Commit to change in Snacks to fruits and vegetables only.  Commit to reducing white and  starchy foods and sweets  Weight loss goal of 15 pounds over the next 6 months.  You are referred to ENT for evaluation of chronic hoarseness.  Try and schedule  your sleep study as this needs to be done so that you can be treated for sleep apnea which is very important.  Fasting labs asap, cBC, lipid, tSH, Vit D, cmp and eGFR  Thanks for choosing [facility], we consider it a privelige to serve you.

## 2021-11-17 NOTE — Progress Notes (Signed)
    [name]     MRN: [id number]      DOB: Apr 03, 1980  HPI: Patient is in for annual physical exam. Chronic hoarseness, OSA, and obesity are addressed Immunization is reviewed , and  updated ed.   PE: BP 124/82   Pulse 87   Ht '5\' 6"'$  (1.676 m)   Wt 214 lb (97.1 kg)   LMP 11/21/2011   SpO2 96%   BMI 34.54 kg/m   Pleasant  female, alert and oriented x 3, in no cardio-pulmonary distress. Afebrile. HEENT No facial trauma or asymetry. Sinuses non tender.  Extra occullar muscles intact.. External ears normal, . Neck: supple, no adenopathy,JVD or thyromegaly.No bruits.  Chest: Clear to ascultation bilaterally.No crackles or wheezes. Non tender to palpation    Cardiovascular system; Heart sounds normal,  S1 and  S2 ,no S3.  No murmur, or thrill. Apical beat not displaced Peripheral pulses normal.  Abdomen: Soft, non tender, no organomegaly or masses. No bruits. Bowel sounds normal. No guarding, tenderness or rebound.    Musculoskeletal exam: Full ROM of spine, hips , shoulders and knees. No deformity ,swelling or crepitus noted. No muscle wasting or atrophy.   Neurologic: Cranial nerves 2 to 12 intact. Power, tone ,sensation and reflexes normal throughout. No disturbance in gait. No tremor.  Skin: Intact, no ulceration, erythema , scaling or rash noted. Pigmentation normal throughout  Psych; Normal mood and affect. Judgement and concentration normal   Assessment & Plan:  Encounter for annual physical exam Annual exam as documented. Counseling done  re healthy lifestyle involving commitment to 150 minutes exercise per week, heart healthy diet, and attaining healthy weight.The importance of adequate sleep also discussed. Regular seat belt use and home safety, is also discussed. Changes in health habits are decided on by the patient with goals and time frames  set for achieving them. Immunization and cancer screening needs are specifically addressed at  this visit.   Obesity (BMI 30.0-34.9)  Patient re-educated about  the importance of commitment to a  minimum of 150 minutes of exercise per week as able.  The importance of healthy food choices with portion control discussed, as well as eating regularly and within a 12 hour window most days. The need to choose "clean , green" food 50 to 75% of the time is discussed, as well as to make water the primary drink and set a goal of 64 ounces water daily.       11/17/2021    4:07 PM 07/13/2021    9:52 AM 05/13/2021    4:10 PM  Weight /BMI  Weight 214 lb 208 lb 8 oz 206 lb 6.4 oz  Height '5\' 6"'$  (1.676 m) '5\' 6"'$  (1.676 m) '5\' 6"'$  (1.676 m)  BMI 34.54 kg/m2 33.65 kg/m2 33.31 kg/m2      Chronic hoarseness Refer eNT for re eval

## 2021-11-17 NOTE — Assessment & Plan Note (Signed)
  Patient re-educated about  the importance of commitment to a  minimum of 150 minutes of exercise per week as able.  The importance of healthy food choices with portion control discussed, as well as eating regularly and within a 12 hour window most days. The need to choose "clean , green" food 50 to 75% of the time is discussed, as well as to make water the primary drink and set a goal of 64 ounces water daily.       11/17/2021    4:07 PM 07/13/2021    9:52 AM 05/13/2021    4:10 PM  Weight /BMI  Weight 214 lb 208 lb 8 oz 206 lb 6.4 oz  Height '5\' 6"'$  (9.432 m) '5\' 6"'$  (1.676 m) '5\' 6"'$  (1.676 m)  BMI 34.54 kg/m2 33.65 kg/m2 33.31 kg/m2

## 2021-11-17 NOTE — Assessment & Plan Note (Signed)

## 2021-12-20 ENCOUNTER — Encounter (INDEPENDENT_AMBULATORY_CARE_PROVIDER_SITE_OTHER): Payer: Self-pay | Admitting: Gastroenterology

## 2022-03-12 ENCOUNTER — Ambulatory Visit: Payer: BC Managed Care – PPO | Admitting: Family Medicine

## 2022-03-12 ENCOUNTER — Encounter: Payer: Self-pay | Admitting: Family Medicine

## 2022-03-12 VITALS — BP 128/81 | HR 80 | Ht 66.0 in | Wt 211.1 lb

## 2022-03-12 DIAGNOSIS — N644 Mastodynia: Secondary | ICD-10-CM | POA: Insufficient documentation

## 2022-03-12 DIAGNOSIS — N6325 Unspecified lump in the left breast, overlapping quadrants: Secondary | ICD-10-CM

## 2022-03-12 NOTE — Assessment & Plan Note (Signed)
1 week history, no trauma

## 2022-03-12 NOTE — Assessment & Plan Note (Signed)
Tender, palpated by pt and spouse 1 week ago, also h/o intermittent painful lumps in left axilla, no nipple d/c Request diag mammo and [place] asap Clinical impression is that abn represent cysts or / and nodules,

## 2022-03-12 NOTE — Patient Instructions (Signed)
F/U as before, call if you need me sooner  You are referred for urgent US and imaging of left breast due to complaints of pain and lump   We will contact you with appointment information  Ultrasound of breast and mammogram will be scheduled  Thanks for choosing [facility], we consider it a privelige to serve you.

## 2022-03-15 NOTE — Progress Notes (Signed)
   [name]     MRN: [id number]      DOB: [birth year]   HPI [name] is here with 1 week h/o painful lump in left breast, no inciting trauma , no new nipple drainage No other concerns voiced or addressed ROS See HPI  Denies skin break down or rash. C/o intermittent tenderness and swelling in left armpit, espescially after shaving,   PE  BP 128/81 (BP Location: Right Arm, Patient Position: Sitting, Cuff Size: Large)   Pulse 80   Ht '5\' 6"'$  (1.676 m)   Wt 211 lb 1.9 oz (95.8 kg)   LMP 11/21/2011   SpO2 97%   BMI 34.08 kg/m   Patient alert and oriented and in no cardiopulmonary distress.  HEENT: No facial asymmetry, EOMI,     Neck supple .  Chest: Clear to auscultation bilaterally.  Breast: right breast no mass or tenderness, no nipple inversion or d/c , no axillary nodes Left breast :tender at 6 o clock  in sub areolar area, cystic nodules palpable, no other areas of tenderness, no nipple inversion or d/c no  axillary or supraclavicular nodes CVS: S1, S2 no murmurs, no S3.Regular rate.  Ext: No edema  MS: Adequate ROM spine, shoulders, hips and knees.  Skin: Intact, no ulcerations or rash noted.  Psych: Good eye contact, normal affect.  not anxious or depressed appearing.  CNS: CN 2-12 intact, power,  normal throughout.no focal deficits noted.   Assessment & Plan  Breast lump on left side at 6 o'clock position Tender, palpated by pt and spouse 1 week ago, also h/o intermittent painful lumps in left axilla, no nipple d/c Request diag mammo and [place] asap Clinical impression is that abn represent cysts or / and nodules,  Breast pain, left 1 week history, no trauma

## 2022-04-01 ENCOUNTER — Ambulatory Visit (HOSPITAL_COMMUNITY)
Admission: RE | Admit: 2022-04-01 | Discharge: 2022-04-01 | Disposition: A | Discharge status: Home / Self Care | Payer: BC Managed Care – PPO | Source: Ambulatory Visit | Attending: Family Medicine | Admitting: Family Medicine

## 2022-04-01 ENCOUNTER — Encounter (HOSPITAL_COMMUNITY): Payer: BC Managed Care – PPO

## 2022-04-01 ENCOUNTER — Ambulatory Visit (HOSPITAL_COMMUNITY): Payer: BC Managed Care – PPO

## 2022-04-01 DIAGNOSIS — N6325 Unspecified lump in the left breast, overlapping quadrants: Secondary | ICD-10-CM | POA: Insufficient documentation

## 2022-04-01 DIAGNOSIS — N644 Mastodynia: Secondary | ICD-10-CM | POA: Insufficient documentation

## 2022-05-11 ENCOUNTER — Encounter (INDEPENDENT_AMBULATORY_CARE_PROVIDER_SITE_OTHER): Payer: Self-pay | Admitting: Gastroenterology

## 2022-05-19 ENCOUNTER — Ambulatory Visit: Payer: BC Managed Care – PPO | Admitting: Family Medicine

## 2022-05-31 DIAGNOSIS — E559 Vitamin D deficiency, unspecified: Secondary | ICD-10-CM | POA: Diagnosis not present

## 2022-05-31 DIAGNOSIS — Z13220 Encounter for screening for lipoid disorders: Secondary | ICD-10-CM | POA: Diagnosis not present

## 2022-05-31 DIAGNOSIS — Z0000 Encounter for general adult medical examination without abnormal findings: Secondary | ICD-10-CM | POA: Diagnosis not present

## 2022-06-01 ENCOUNTER — Encounter: Payer: Self-pay | Admitting: Family Medicine

## 2022-06-01 ENCOUNTER — Ambulatory Visit: Payer: BC Managed Care – PPO | Admitting: Family Medicine

## 2022-06-01 VITALS — BP 121/74 | HR 76 | Resp 16 | Ht 66.0 in | Wt 203.0 lb

## 2022-06-01 DIAGNOSIS — Z1231 Encounter for screening mammogram for malignant neoplasm of breast: Secondary | ICD-10-CM

## 2022-06-01 DIAGNOSIS — K219 Gastro-esophageal reflux disease without esophagitis: Secondary | ICD-10-CM

## 2022-06-01 DIAGNOSIS — E669 Obesity, unspecified: Secondary | ICD-10-CM | POA: Diagnosis not present

## 2022-06-01 DIAGNOSIS — E559 Vitamin D deficiency, unspecified: Secondary | ICD-10-CM

## 2022-06-01 DIAGNOSIS — R490 Dysphonia: Secondary | ICD-10-CM

## 2022-06-01 DIAGNOSIS — K5900 Constipation, unspecified: Secondary | ICD-10-CM

## 2022-06-01 LAB — CMP14+EGFR
ALT: 10 IU/L (ref 0–32)
AST: 26 IU/L (ref 0–40)
Albumin/Globulin Ratio: 1.6 (ref 1.2–2.2)
Albumin: 4 g/dL (ref 3.9–4.9)
Alkaline Phosphatase: 55 IU/L (ref 44–121)
BUN/Creatinine Ratio: 12 (ref 9–23)
BUN: 9 mg/dL (ref 6–24)
Bilirubin Total: 0.7 mg/dL (ref 0.0–1.2)
CO2: 20 mmol/L (ref 20–29)
Calcium: 9.1 mg/dL (ref 8.7–10.2)
Chloride: 101 mmol/L (ref 96–106)
Creatinine, Ser: 0.78 mg/dL (ref 0.57–1.00)
Globulin, Total: 2.5 g/dL (ref 1.5–4.5)
Glucose: 69 mg/dL — ABNORMAL LOW (ref 70–99)
Potassium: 3.8 mmol/L (ref 3.5–5.2)
Sodium: 136 mmol/L (ref 134–144)
Total Protein: 6.5 g/dL (ref 6.0–8.5)
eGFR: 97 mL/min/{1.73_m2} (ref >59)

## 2022-06-01 LAB — LIPID PANEL
Chol/HDL Ratio: 2 ratio (ref 0.0–4.4)
Cholesterol, Total: 126 mg/dL (ref 100–199)
HDL: 64 mg/dL (ref >39)
LDL Chol Calc (NIH): 51 mg/dL (ref 0–99)
Triglycerides: 47 mg/dL (ref 0–149)
VLDL Cholesterol Cal: 11 mg/dL (ref 5–40)

## 2022-06-01 LAB — CBC
Hematocrit: 36.6 % (ref 34.0–46.6)
Hemoglobin: 12.3 g/dL (ref 11.1–15.9)
MCH: 31 pg (ref 26.6–33.0)
MCHC: 33.6 g/dL (ref 31.5–35.7)
MCV: 92 fL (ref 79–97)
Platelets: 261 10*3/uL (ref 150–450)
RBC: 3.97 x10E6/uL (ref 3.77–5.28)
RDW: 12.8 % (ref 11.7–15.4)
WBC: 5.8 10*3/uL (ref 3.4–10.8)

## 2022-06-01 LAB — TSH: TSH: 1.52 u[IU]/mL (ref 0.450–4.500)

## 2022-06-01 LAB — VITAMIN D 25 HYDROXY (VIT D DEFICIENCY, FRACTURES): Vit D, 25-Hydroxy: 19.5 ng/mL — ABNORMAL LOW (ref 30.0–100.0)

## 2022-06-01 MED ORDER — VITAMIN D (ERGOCALCIFEROL) 1.25 MG (50000 UNIT) PO CAPS: 50000.0000 [IU] | ORAL_CAPSULE | ORAL | 2 refills | Status: AC

## 2022-06-01 MED ORDER — OMEPRAZOLE 40 MG PO CPDR: 40.0000 mg | DELAYED_RELEASE_CAPSULE | Freq: Every day | ORAL | 1 refills | Status: AC

## 2022-06-01 NOTE — Patient Instructions (Addendum)
Annual exam inmid to end September, call if you need me sooner  Start daily omeprazole and get an appointment with ENT, pls discuss omeprazole dosing and ask ENT to take over.  Once weekly vit D is needed, please take this  CONGRATS, keep up changes in habits they are definitely working well  We are targeting 12 pounds weight loss  Please schedule at checkout October 17 or after mammogram  It is important that you exercise regularly at least 30 minutes 5 times a week. If you develop chest pain, have severe difficulty breathing, or feel very tired, stop exercising immediately and seek medical attention    Thanks for choosing [facility], we consider it a privelige to serve you.

## 2022-06-02 ENCOUNTER — Encounter: Payer: Self-pay | Admitting: Family Medicine

## 2022-06-02 DIAGNOSIS — E559 Vitamin D deficiency, unspecified: Secondary | ICD-10-CM | POA: Insufficient documentation

## 2022-06-02 NOTE — Assessment & Plan Note (Signed)
Controlled, no change in medication Managed by GI 

## 2022-06-02 NOTE — Assessment & Plan Note (Signed)
Worsened, needs ENT re eval

## 2022-06-02 NOTE — Assessment & Plan Note (Signed)
Rx sent for weekly vit D

## 2022-06-02 NOTE — Assessment & Plan Note (Signed)
Uncontrolled, needs to take omeprazole daily as prescribed

## 2022-06-02 NOTE — Progress Notes (Signed)
   [name]     MRN: [id number]      DOB: 13-Jan-1981  Chief Complaint  Patient presents with   Follow-up    4 month follow up visit     HPI [name] is here for follow up and re-evaluation of chronic medical conditions, medication management and review of any available recent lab and radiology data.  Preventive health is updated, specifically  Cancer screening and Immunization.   Questions or concerns regarding consultations or procedures which the PT has had in the interim are  addressed.Needs to reschedule ENT eval for nodules on larynx Needs to commit to taking daily omeprazole Abdominal pain resolved and bowel movements now regular on medication, does not take linzess in the mornings when she goes to work The PT denies any adverse reactions to current medications since the last visit.  Consistent good changes in food choice and eating habits, current exercise commitment to 3 days/week at least 5 days per week, minimum Weight loss is encouraging though seems slow to her until we discussed it ROS Denies recent fever or chills. Denies sinus pressure, nasal congestion, ear pain or sore throat. Denies chest congestion, productive cough or wheezing. Denies chest pains, palpitations and leg swelling Denies abdominal pain, nausea, vomiting,diarrhea or constipation.   Denies dysuria, frequency, hesitancy or incontinence. Denies joint pain, swelling and limitation in mobility. Denies headaches, seizures, numbness, or tingling. Denies depression, anxiety or insomnia. Denies skin break down or rash.   PE  BP 121/74   Pulse 76   Resp 16   Ht 5\' 6"  (1.676 m)   Wt 203 lb (92.1 kg)   LMP 11/21/2011   SpO2 98%   BMI 32.77 kg/m   Patient alert and oriented and in no cardiopulmonary distress.  HEENT: No facial asymmetry, EOMI,     Neck supple .  Chest: Clear to auscultation bilaterally.  CVS: S1, S2 no murmurs, no S3.Regular rate.  ABD: Soft non tender.   Ext: No edema  MS:  Adequate ROM spine, shoulders, hips and knees.  Skin: Intact, no ulcerations or rash noted.  Psych: Good eye contact, normal affect. Memory intact not anxious or depressed appearing.  CNS: CN 2-12 intact, power,  normal throughout.no focal deficits noted.   Assessment & Plan  GERD (gastroesophageal reflux disease) Uncontrolled, needs to take omeprazole daily as prescribed  Obesity (BMI 30.0-34.9)  Patient re-educated about  the importance of commitment to a  minimum of 150 minutes of exercise per week as able.  The importance of healthy food choices with portion control discussed, as well as eating regularly and within a 12 hour window most days. The need to choose "clean , green" food 50 to 75% of the time is discussed, as well as to make water the primary drink and set a goal of 64 ounces water daily.       06/01/2022    2:34 PM 03/12/2022    3:23 PM 11/17/2021    4:07 PM  Weight /BMI  Weight 203 lb 211 lb 1.9 oz 214 lb  Height 5\' 6"  (1.676 m) 5\' 6"  (1.676 m) 5\' 6"  (1.676 m)  BMI 32.77 kg/m2 34.08 kg/m2 34.54 kg/m2    Improved, continue lifestyle changes  Chronic hoarseness Worsened, needs ENT re eval  Constipation Controlled, no change in medication Managed by GI  Vitamin D deficiency Rx sent for weekly vit D

## 2022-06-02 NOTE — Assessment & Plan Note (Signed)
  Patient re-educated about  the importance of commitment to a  minimum of 150 minutes of exercise per week as able.  The importance of healthy food choices with portion control discussed, as well as eating regularly and within a 12 hour window most days. The need to choose "clean , green" food 50 to 75% of the time is discussed, as well as to make water the primary drink and set a goal of 64 ounces water daily.       06/01/2022    2:34 PM 03/12/2022    3:23 PM 11/17/2021    4:07 PM  Weight /BMI  Weight 203 lb 211 lb 1.9 oz 214 lb  Height 5\' 6"  (1.676 m) 5\' 6"  (1.676 m) 5\' 6"  (1.676 m)  BMI 32.77 kg/m2 34.08 kg/m2 34.54 kg/m2    Improved, continue lifestyle changes

## 2022-06-09 ENCOUNTER — Encounter (INDEPENDENT_AMBULATORY_CARE_PROVIDER_SITE_OTHER): Payer: Self-pay | Admitting: Gastroenterology

## 2022-07-15 ENCOUNTER — Ambulatory Visit (INDEPENDENT_AMBULATORY_CARE_PROVIDER_SITE_OTHER): Payer: 59 | Admitting: Gastroenterology

## 2022-09-06 ENCOUNTER — Ambulatory Visit (INDEPENDENT_AMBULATORY_CARE_PROVIDER_SITE_OTHER): Payer: BC Managed Care – PPO | Admitting: Gastroenterology

## 2022-09-15 ENCOUNTER — Encounter (INDEPENDENT_AMBULATORY_CARE_PROVIDER_SITE_OTHER): Payer: Self-pay | Admitting: Gastroenterology

## 2022-10-05 ENCOUNTER — Ambulatory Visit (INDEPENDENT_AMBULATORY_CARE_PROVIDER_SITE_OTHER): Payer: BC Managed Care – PPO | Admitting: Gastroenterology

## 2022-11-02 ENCOUNTER — Ambulatory Visit (INDEPENDENT_AMBULATORY_CARE_PROVIDER_SITE_OTHER): Payer: BC Managed Care – PPO | Admitting: Gastroenterology

## 2022-11-03 ENCOUNTER — Other Ambulatory Visit (HOSPITAL_COMMUNITY): Payer: Self-pay | Admitting: Family Medicine

## 2022-11-03 DIAGNOSIS — Z1231 Encounter for screening mammogram for malignant neoplasm of breast: Secondary | ICD-10-CM

## 2022-11-08 ENCOUNTER — Ambulatory Visit: Payer: Self-pay | Admitting: Podiatry

## 2022-11-10 ENCOUNTER — Inpatient Hospital Stay (HOSPITAL_COMMUNITY): Admission: RE | Admit: 2022-11-10 | Payer: BC Managed Care – PPO | Source: Ambulatory Visit

## 2022-11-10 DIAGNOSIS — Z1231 Encounter for screening mammogram for malignant neoplasm of breast: Secondary | ICD-10-CM

## 2022-11-15 ENCOUNTER — Ambulatory Visit: Payer: Self-pay | Admitting: Podiatry

## 2022-11-22 ENCOUNTER — Inpatient Hospital Stay (HOSPITAL_COMMUNITY): Admission: RE | Admit: 2022-11-22 | Payer: BC Managed Care – PPO | Source: Ambulatory Visit

## 2022-11-23 ENCOUNTER — Other Ambulatory Visit (HOSPITAL_COMMUNITY): Payer: Self-pay | Admitting: Family Medicine

## 2022-11-23 DIAGNOSIS — Z1231 Encounter for screening mammogram for malignant neoplasm of breast: Secondary | ICD-10-CM

## 2022-11-25 ENCOUNTER — Encounter (HOSPITAL_COMMUNITY): Payer: Self-pay

## 2022-11-25 ENCOUNTER — Ambulatory Visit (HOSPITAL_COMMUNITY)
Admission: RE | Admit: 2022-11-25 | Discharge: 2022-11-25 | Disposition: A | Discharge status: Home / Self Care | Payer: BC Managed Care – PPO | Source: Ambulatory Visit

## 2022-11-25 DIAGNOSIS — Z1231 Encounter for screening mammogram for malignant neoplasm of breast: Secondary | ICD-10-CM | POA: Insufficient documentation

## 2022-12-07 ENCOUNTER — Ambulatory Visit (INDEPENDENT_AMBULATORY_CARE_PROVIDER_SITE_OTHER): Payer: BC Managed Care – PPO

## 2022-12-07 ENCOUNTER — Ambulatory Visit: Payer: BC Managed Care – PPO | Admitting: Podiatry

## 2022-12-07 VITALS — Ht 66.0 in | Wt 210.0 lb

## 2022-12-07 DIAGNOSIS — M7751 Other enthesopathy of right foot: Secondary | ICD-10-CM

## 2022-12-07 DIAGNOSIS — M778 Other enthesopathies, not elsewhere classified: Secondary | ICD-10-CM | POA: Diagnosis not present

## 2022-12-07 DIAGNOSIS — M7671 Peroneal tendinitis, right leg: Secondary | ICD-10-CM

## 2022-12-07 NOTE — Patient Instructions (Signed)
VISIT SUMMARY:  You came in today due to right foot pain that started over the summer. You have been managing the pain with Tylenol, but it has not fully resolved. We discussed your symptoms and provided treatment to help alleviate the pain.  YOUR PLAN:  -INTERMETATARSAL BURSITIS: Intermetatarsal bursitis is inflammation of the bursa, a fluid-filled sac, between the metatarsal bones in your foot. This can cause pain and tenderness. We gave you a corticosteroid injection to reduce inflammation and pain. If your symptoms do not improve by mid-December or if they worsen, we may consider an oral steroid and using a walking boot. If there is still no improvement, we will consider an MRI for a more detailed evaluation. We also recommended using over-the-counter Voltaren gel 3-4 times a day to help with the inflammation.  -PHYSICAL THERAPY: We will continue physical therapy exercises for 1-2 months to help alleviate the pain on the side of your foot. Physical therapy can help strengthen the muscles and improve flexibility, which can reduce pain.  INSTRUCTIONS:  If your symptoms do not improve by mid-December or if they worsen, please contact us. We may consider an oral steroid and using a walking boot. If there is still no improvement, we will consider an MRI for a more detailed evaluation.  Peroneal Tendinopathy Rehab Ask your health care provider which exercises are safe for you. Do exercises exactly as told by your health care provider and adjust them as directed. It is normal to feel mild stretching, pulling, tightness, or discomfort as you do these exercises. Stop right away if you feel sudden pain or your pain gets worse. Do not begin these exercises until told by your health care provider. Stretching and range-of-motion exercises These exercises warm up your muscles and joints and improve the movement and flexibility of your ankle. These exercises also help to relieve pain and stiffness. Gastroc  and soleus stretch, standing  This is an exercise in which you stand on a step and use your body weight to stretch your calf muscles. To do this exercise: Stand on the edge of a step on the ball of your left / right foot. The ball of your foot is on the walking surface, right under your toes. Keep your other foot firmly on the same step. Hold on to the wall, a railing, or a chair for balance. Slowly lift your other foot, allowing your body weight to press your left / right heel down over the edge of the step. You should feel a stretch in your left / right calf (gastrocnemius and soleus). Hold this position for 15 seconds. Return both feet to the step. Repeat this exercise with a slight bend in your left / right knee. Repeat 5 times with your left / right knee straight and 5 times with your left / right knee bent. Complete this exercise 2 times a day. Strengthening exercises These exercises build strength and endurance in your foot and ankle. Endurance is the ability to use your muscles for a long time, even after they get tired. Ankle dorsiflexion with band   Secure a rubber exercise band or tube to an object, such as a table leg, that will not move when the band is pulled. Secure the other end of the band around your left / right foot. Sit on the floor, facing the object with your left / right leg extended. The band or tube should be slightly tense when your foot is relaxed. Slowly flex your left / right ankle  and toes to bring your foot toward you (dorsiflexion). Hold this position for 15 seconds. Let the band or tube slowly pull your foot back to the starting position. Repeat 5 times. Complete this exercise 2 times a day. Ankle eversion Sit on the floor with your legs straight out in front of you. Loop a rubber exercise band or tube around the ball of your left / right foot. The ball of your foot is on the walking surface, right under your toes. Hold the ends of the band in your hands,  or secure the band to a stable object. The band or tube should be slightly tense when your foot is relaxed. Slowly push your foot outward, away from your other leg (eversion). Hold this position for 15 seconds. Slowly return your foot to the starting position. Repeat 5 times. Complete this exercise 2 times a day. Plantar flexion, standing  This exercise is sometimes called standing heel raise. Stand with your feet shoulder-width apart. Place your hands on a wall or table to steady yourself as needed, but try not to use it for support. Keep your weight spread evenly over the width of your feet while you slowly rise up on your toes (plantar flexion). If told by your health care provider: Shift your weight toward your left / right leg until you feel challenged. Stand on your left / right leg only. Hold this position for 15 seconds. Repeat 2 times. Complete this exercise 2 times a day. Single leg stand Without shoes, stand near a railing or in a doorway. You may hold on to the railing or door frame as needed. Stand on your left / right foot. Keep your big toe down on the floor and try to keep your arch lifted. Do not roll to the outside of your foot. If this exercise is too easy, you can try it with your eyes closed or while standing on a pillow. Hold this position for 15 seconds. Repeat 5 times. Complete this exercise 2 times a day. This information is not intended to replace advice given to you by your health care provider. Make sure you discuss any questions you have with your health care provider. Document Revised: 05/09/2018 Document Reviewed: 05/09/2018 Elsevier Patient Education  2020 [employer].

## 2022-12-07 NOTE — Progress Notes (Signed)
  Subjective:  Patient ID: [name], female    DOB: [birth year],  MRN: [id number]  Chief Complaint  Patient presents with   Foot Pain    Right foot. Worse with weight bearing. Rest, Tylenol does not help. Has not tried ice or heat. Has husband and daughter with her.     Discussed the use of AI scribe software for clinical note transcription with the patient, who gave verbal consent to proceed.  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 She has been managing the pain with over-the-counter Tylenol. She also has a history of gastric reflux, which limits her ability to take strong anti-inflammatory medications.          Objective:    Physical Exam   EXTREMITIES: Right foot exhibits warmth, good capillary fill time, palpable dorsalis pedis and posterior tibial pulses. MUSCULOSKELETAL: Pain and tenderness at the base of the fifth metatarsal, no evidence of fracture, ecchymosis, or gross deformity. Tenderness over the proximal fourth and fifth metatarsal spaces, worse with lateral compression of the interspace, suggesting intermetatarsal bursitis.       No images are attached to the encounter.    Results   Procedure: Corticosteroid injection of right fourth intermetatarsal space Description: Following a prep with alcohol, the right fourth intermetatarsal space was injected through a dorsal approach with 1 cc of 0.5% bupivacaine plain, 20 mg of Kenalog, and 4 mg of dexamethasone. The patient tolerated the procedure well and was dressed with a Band-Aid. Informed Consent: We discussed the risks, benefits, and alternatives of the injection.  RADIOLOGY Right foot X-ray: No acute fracture, no stress fracture, no major  arthritic changes, os perineum accessory ossicle at the peroneal sulcus (12/06/2022)      Assessment:   1. Bursitis of intermetatarsal bursa of right foot   2. Peroneal tendinitis, right      Plan:  Patient was evaluated and treated and all questions answered.  Assessment and Plan    Intermetatarsal Bursitis   She exhibits pain and tenderness at the fifth metatarsal base and proximal fourth and fifth metatarsals in the interspace, with radiographs showing no acute fracture, stress fracture, or major arthritic changes. We administered a corticosteroid injection in the right fourth intermetatarsal space with 1 CC of 0.5% bupivacaine plain, 20mg  of Kenalog, and 4mg  of dexamethasone. Should there be no improvement by mid-December or if symptoms worsen, we will consider an oral steroid like prednisone and time in a walking boot. Further lack of improvement will lead [place] to consider an MRI for more detailed evaluation. We recommended over-the-counter topical anti-inflammatory diclofenac gel (Voltaren gel) 3-4 times a day.  Physical Therapy   She reports pain on the side of the foot. We will continue physical therapy exercises for 1-2 months until the pain subsides.          No follow-ups on file.

## 2023-01-17 ENCOUNTER — Encounter: Payer: Self-pay | Admitting: Podiatry

## 2023-01-17 ENCOUNTER — Ambulatory Visit: Payer: BC Managed Care – PPO | Admitting: Podiatry

## 2023-01-17 DIAGNOSIS — M7671 Peroneal tendinitis, right leg: Secondary | ICD-10-CM | POA: Diagnosis not present

## 2023-01-17 DIAGNOSIS — M84374A Stress fracture, right foot, initial encounter for fracture: Secondary | ICD-10-CM | POA: Diagnosis not present

## 2023-01-17 DIAGNOSIS — M7751 Other enthesopathy of right foot: Secondary | ICD-10-CM | POA: Diagnosis not present

## 2023-01-17 MED ORDER — METHYLPREDNISOLONE 4 MG PO TBPK: ORAL_TABLET | ORAL | 0 refills | Status: DC

## 2023-01-17 NOTE — Patient Instructions (Addendum)
Call [facility] to schedule your MRI at the below locations.  Please allow at least 1 business day after your visit to process the referral.  It may take longer depending on approval from insurance.  Please let me know if you have issues or problems scheduling the MRI   [facility] [phone] [address] [place] [address] [place], [place] [address]  [facility] [phone] [address] [place], [place] [address]   Look for an "EvenUp" shoe attachment on [employer] or at [employer]. This will level out your hips while you are walking in the CAM boot. Wear this on the other foot around a supportive sneaker:

## 2023-01-18 NOTE — Progress Notes (Signed)
  Subjective:  Patient ID: [name], female    DOB: 04/10/1980,  MRN: [id number]  Chief Complaint  Patient presents with   Foot Pain    "It's not getting any better.  I been experiencing some burning and it hurts really bad."     She returns for follow-up notes no improvement after the injections     Objective:    Physical Exam   EXTREMITIES: Right foot exhibits warmth, good capillary fill time, palpable dorsalis pedis and posterior tibial pulses. MUSCULOSKELETAL: Pain and tenderness at the base of the fifth metatarsal, no evidence of fracture, ecchymosis, or gross deformity. Tenderness over the proximal fourth and fifth metatarsal spaces, worse with lateral compression of the interspace, suggesting intermetatarsal bursitis.  Pain over the proximal fourth metatarsal base as well      No images are attached to the encounter.    Results   Procedure: Corticosteroid injection of right fourth intermetatarsal space Description: Following a prep with alcohol, the right fourth intermetatarsal space was injected through a dorsal approach with 1 cc of 0.5% bupivacaine plain, 20 mg of Kenalog, and 4 mg of dexamethasone. The patient tolerated the procedure well and was dressed with a Band-Aid. Informed Consent: We discussed the risks, benefits, and alternatives of the injection.  RADIOLOGY Right foot X-ray: No acute fracture, no stress fracture, no major arthritic changes, os perineum accessory ossicle at the peroneal sulcus (12/06/2022)      Assessment:   Encounter Diagnoses  Name Primary?   Bursitis of intermetatarsal bursa of right foot Yes   Peroneal tendinitis, right    Stress fracture of metatarsal bone of right foot, initial encounter      Plan:  Patient was evaluated and treated and all questions answered.  Assessment and Plan    Intermetatarsal Bursitis   Today she had worsening pain and no improvement in the peroneal area at the sulcus, the intermetatarsal space or  the fourth proximal metatarsal.  Some of this pain could be consistent with a stress fracture.  I recommended today that she be immobilized in a cam walker boot to allow for bony and soft tissue healing and rest and this was dispensed today.  I did prescribe her a methylprednisolone taper for reduction of inflammation and I recommended MRI to evaluate for stress fracture as well as peroneal tendinopathy.  Referral for this was placed.  I will see her back after the study to reevaluate.     No follow-ups on file.

## 2023-02-04 ENCOUNTER — Ambulatory Visit
Admission: RE | Admit: 2023-02-04 | Discharge: 2023-02-04 | Disposition: A | Discharge status: Home / Self Care | Payer: BC Managed Care – PPO | Source: Ambulatory Visit | Attending: Podiatry | Admitting: Podiatry

## 2023-02-04 DIAGNOSIS — M84374A Stress fracture, right foot, initial encounter for fracture: Secondary | ICD-10-CM

## 2023-02-04 DIAGNOSIS — M79671 Pain in right foot: Secondary | ICD-10-CM | POA: Diagnosis not present

## 2023-02-04 DIAGNOSIS — M7751 Other enthesopathy of right foot: Secondary | ICD-10-CM

## 2023-03-01 ENCOUNTER — Ambulatory Visit: Payer: BC Managed Care – PPO | Admitting: Podiatry

## 2023-03-10 ENCOUNTER — Encounter: Payer: Self-pay | Admitting: Podiatry

## 2023-03-10 ENCOUNTER — Ambulatory Visit: Payer: BC Managed Care – PPO | Admitting: Podiatry

## 2023-03-10 DIAGNOSIS — M7671 Peroneal tendinitis, right leg: Secondary | ICD-10-CM | POA: Diagnosis not present

## 2023-03-10 NOTE — Progress Notes (Signed)
 Subjective:  Patient ID: [name], female    DOB: 1980/09/08,  MRN: [id number]  Chief Complaint  Patient presents with   Routine Post Op    Patient is here for MRI results      She returns for follow-up she wore the boot for about 4 weeks and had to remove it because of the icy weather, has not improved much the prednisone  helped only while she was taking it   Objective:    Physical Exam   EXTREMITIES: Right foot exhibits warmth, good capillary fill time, palpable dorsalis pedis and posterior tibial pulses. MUSCULOSKELETAL: Today pain at the dorsum of the peroneus brevis on the fifth metatarsal and into the peroneal sulcus rating into the plantar forefoot mild pain in the fourth dorsal interspace      Right foot MRI 02/04/2023  Study Result  Narrative & Impression  CLINICAL DATA:  Chronic foot pain and numbness since last summer. Pain mostly around the base of the fourth and fifth metatarsals. No injury or prior surgery.   EXAM: MRI OF THE RIGHT FOREFOOT WITHOUT CONTRAST   TECHNIQUE: Multiplanar, multisequence MR imaging of the right foot was performed. No intravenous contrast was administered.   COMPARISON:  Right foot x-rays dated December 07, 2022.   FINDINGS: Bones/Joint/Cartilage   No marrow signal abnormality. No fracture or dislocation. Joint spaces are preserved. No joint effusion.   Ligaments   Collateral ligaments are intact.  Lisfranc ligament is intact.   Muscles and Tendons Flexor and extensor tendons are intact. No tenosynovitis. No muscle edema or atrophy.   Soft tissue No fluid collection or hematoma.  No soft tissue mass.   IMPRESSION: 1. Normal MRI of the right foot.     Electronically Signed   By: Elsie ONEIDA Shoulder M.D.   On: 02/12/2023 20:08   RADIOLOGY Right foot X-ray: No acute fracture, no stress fracture, no major arthritic changes, os perineum accessory ossicle at the peroneal sulcus (12/06/2022)      Assessment:    Encounter Diagnosis  Name Primary?   Peroneal tendinitis, right Yes     Plan:  Patient was evaluated and treated and all questions answered.  Assessment and Plan     We reviewed the results of her MRI.  We discussed the pain she has is still along the peroneal tendons at the insertion and the peroneal sulcus.  Previously we did intermetatarsal space injection which did not help.  I recommended corticosteroid injection at the fifth metatarsal base insertion of the Prince brevis and into the peroneal sulcus along the peroneal tendon sheath.  We discussed the risk of rupture tearing and she will utilize the boot for 2 weeks to mitigate this.  I discussed with her that the MRI did not complete the capture of the bulk of the peroneal tendons due to the differentiation between foot and ankle MRI.  If still painful or not improving would recommend a follow-up ultrasound along the peroneal tendons.  Also recommend physical therapy and referral was placed for this at any pain rehab.  She will follow-up with me in 6 weeks.  Rest are discussed appropriate shoe gear and I recommended [employer] for fitting  Following sterile prep with alcohol and utilizing ethyl chloride spray as a local anesthetic the peroneal tendon sheath and insertion site was injected with 10 mg of Kenalog 4 mg of dexamethasone  and 0.5 cc of 0.5% Marcaine  plain.  She tolerated the procedure well.     No follow-ups on  file.

## 2023-03-10 NOTE — Patient Instructions (Signed)

## 2023-04-05 ENCOUNTER — Ambulatory Visit (HOSPITAL_COMMUNITY): Payer: BC Managed Care – PPO | Attending: Podiatry

## 2023-04-05 ENCOUNTER — Other Ambulatory Visit: Payer: Self-pay

## 2023-04-05 ENCOUNTER — Encounter (HOSPITAL_COMMUNITY): Payer: Self-pay

## 2023-04-05 DIAGNOSIS — M79671 Pain in right foot: Secondary | ICD-10-CM | POA: Diagnosis not present

## 2023-04-05 DIAGNOSIS — M7671 Peroneal tendinitis, right leg: Secondary | ICD-10-CM | POA: Insufficient documentation

## 2023-04-05 DIAGNOSIS — M25671 Stiffness of right ankle, not elsewhere classified: Secondary | ICD-10-CM | POA: Insufficient documentation

## 2023-04-05 NOTE — Therapy (Signed)
 OUTPATIENT PHYSICAL THERAPY LOWER EXTREMITY EVALUATION   Patient Name: [name] MRN: [id number] DOB:12/27/1980, 43 y.o., female Today's Date: 04/05/2023  END OF SESSION:  PT End of Session - 04/05/23 1518     Visit Number 1    Number of Visits 13    Date for PT Re-Evaluation 05/17/23    Authorization Type BL[employer]    Authorization Time Period (12 requested)    Progress Note Due on Visit 7    PT Start Time 1518    PT Stop Time 1603    PT Time Calculation (min) 45 min    Activity Tolerance Patient tolerated treatment well    Behavior During Therapy [facility] for tasks assessed/performed             Past Medical History:  Diagnosis Date   Anxiety    Hoarseness, chronic 03/12/2013   No pertinent past medical history    Past Surgical History:  Procedure Laterality Date   ABDOMINAL HYSTERECTOMY  2013   BIOPSY  04/01/2021   Procedure: BIOPSY;  Surgeon: Dolores Frame, MD;  [facility];  Service: Gastroenterology;;   CESAREAN SECTION  2012   twins   ESOPHAGOGASTRODUODENOSCOPY N/A 05/02/2013   Procedure: ESOPHAGOGASTRODUODENOSCOPY (EGD);  Surgeon: Malissa Hippo, MD;  [facility];  Service: Endoscopy;  Laterality: N/A;  230   ESOPHAGOGASTRODUODENOSCOPY (EGD) WITH PROPOFOL N/A 04/01/2021   Procedure: ESOPHAGOGASTRODUODENOSCOPY (EGD) WITH PROPOFOL;  Surgeon: Dolores Frame, MD;  [facility];  Service: Gastroenterology;  Laterality: N/A;  245   LAPAROSCOPIC ASSISTED VAGINAL HYSTERECTOMY  12/13/2011   Procedure: LAPAROSCOPIC ASSISTED VAGINAL HYSTERECTOMY;  Surgeon: Turner Daniels, MD;  Location: [facility];  Service: Gynecology;  Laterality: N/A;   SAVORY DILATION  04/01/2021   Procedure: SAVORY DILATION;  Surgeon: Marguerita Merles, Reuel Boom, MD;  [facility];  Service: Gastroenterology;;   TONSILLECTOMY     WISDOM TOOTH EXTRACTION     Patient Active Problem List   Diagnosis Date Noted   Vitamin D deficiency  06/02/2022   Breast lump on left side at 6 o'clock position 03/12/2022   Breast pain, left 03/12/2022   Encounter for annual physical exam 11/17/2021   Celiac disease 07/13/2021   IBS (irritable bowel syndrome) 03/12/2021   Constipation 02/03/2021   Degenerative tear of lateral meniscus of right knee 07/06/2019   Snoring 07/21/2015   GERD (gastroesophageal reflux disease) 03/12/2013   Seasonal and perennial allergic rhinitis 03/12/2013   Chronic hoarseness 03/12/2013   Obesity (BMI 30.0-34.9) 06/06/2008    PCP: Kerri Perches, MD   REFERRING PROVIDER: Edwin Cap, DPM  REFERRING DIAG: [phone] (ICD-10-CM) - Peroneal tendinitis, right  THERAPY DIAG:  Pain in right foot  Stiffness of right ankle joint  Rationale for Evaluation and Treatment: Rehabilitation  ONSET DATE: Summer 2024   SUBJECTIVE:   SUBJECTIVE STATEMENT: Pt reports 7/10 pain, right foot. 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 Foot cramps up every once in a while. Pt reports wearing boot for about a month and a half without any progress. Pt also reports having tried injection therapy which was not successful.  PERTINENT HISTORY: TFC -right foot pain fourth and fifth metatarsal base and peroneal insertion suspect stress fracture of metatarsal, possible bursitis did not respond well to injection therapy Has had corticosteroid injections pt states these did not touch her pain. PAIN:  Are you having pain? Yes: NPRS  scale: 7/10 Pain location: sole around base of fifth, dorsum of lateral foot. Pain description: stabbing, cramping pain Aggravating factors: movement, walking, standing for a long period of time Relieving factors: rest, elevation Pain does increase, just depends on activity level PRECAUTIONS: None   WEIGHT BEARING RESTRICTIONS: No  FALLS:  Has patient fallen in last 6 months? 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  DIAGNOSTIC FINDINGS: MRI of R foot CLINICAL DATA:  Chronic foot pain and numbness since last summer. Pain mostly around the base of the fourth and fifth metatarsals. No injury or prior surgery.   EXAM: MRI OF THE RIGHT FOREFOOT WITHOUT CONTRAST   TECHNIQUE: Multiplanar, multisequence MR imaging of the right foot was performed. No intravenous contrast was administered.   COMPARISON:  Right foot x-rays dated December 07, 2022.   FINDINGS: Bones/Joint/Cartilage   No marrow signal abnormality. No fracture or dislocation. Joint spaces are preserved. No joint effusion.   Ligaments   Collateral ligaments are intact.  Lisfranc ligament is intact.   Muscles and Tendons Flexor and extensor tendons are intact. No tenosynovitis. No muscle edema or atrophy.   Soft tissue No fluid collection or hematoma.  No soft tissue mass.   IMPRESSION: 1. Normal MRI of the right foot.     Electronically Signed   By: Obie Dredge M.D.   On: 02/12/2023 20:08  PATIENT SURVEYS:  LEFS 39/80  COGNITION: Overall cognitive status: Within functional limits for tasks assessed     SENSATION: Pt states she is able to feel it fine but will go numb occasionally if she sits in one position too long.    PALPATION: Tender to palpation   LOWER EXTREMITY ROM:  Active ROM Right eval Left eval  Hip flexion    Hip extension    Hip abduction    Hip adduction    Hip internal rotation    Hip external rotation    Knee flexion    Knee extension    Ankle dorsiflexion 6, pain 15  Ankle plantarflexion 45, pain 62  Ankle inversion 18, pain 20  Ankle eversion 20, pain 20   (Blank rows = not tested)  LOWER EXTREMITY MMT:  MMT Right eval  Left eval  Hip flexion    Hip extension    Hip abduction    Hip adduction    Hip internal rotation    Hip external rotation    Knee flexion    Knee extension    Ankle dorsiflexion 3+, pain 5  Ankle plantarflexion 4-, pain same every rep 5  Ankle inversion 3, pain 5  Ankle eversion 3-, pain 5   (Blank rows = not tested)   FUNCTIONAL TESTS:  2 minute walk test: 378 feet SLS: L: 30s     R:  8s, 21s muscle fatigue GAIT: Distance walked: 378 feet Assistive device utilized: None Level of assistance: Complete Independence Comments: Pt demonstrates antalgic gait with decreased speed, and stride length and favoring of RLE.  TREATMENT DATE:  04/05/2023  Therapeutic Exercise: -Heel raises 1 set of 15 bilaterally   PATIENT EDUCATION:  Education details: Pt was educated on findings of PT evaluation, prognosis, frequency of therapy visits and rationale, attendance policy, and HEP if given.   Person educated: Patient Education method: Explanation, Demonstration, Tactile cues, and Handouts Education comprehension: verbalized understanding  HOME EXERCISE PROGRAM: Access Code: [email or web address] URL: [email or web address] Date: 04/05/2023 Prepared by: Luz Lex  Exercises - Standing Heel Raise  - 1 x daily - 7 x weekly - 3 sets - 10 reps - Ankle Alphabet in Elevation  - 1 x daily - 7 x weekly - 3 sets - 10 reps Note: Unable to give pt handout following initial evaluation but was able to access HEP via medbridge app.  ASSESSMENT:  CLINICAL IMPRESSION: Patient is a 43 y.o. female who was seen today for physical therapy evaluation and treatment for M76.71 (ICD-10-CM) - Peroneal tendinitis, right. Pt demonstrates decreased gait speed and pattern (decreased speed and stride lengths). Pt is motivated and is willing to comply with all suggested therapy to the  best of her ability. Pt demonstrates decreased ROM and strength on RLE compared to the LLE due to pain during assessment. Patient would benefit from skilled physical therapy to decrease pain of RLE, increase R ankle ROM and strength to improve quality of life, community ambulation and progression towards therapy goals.   OBJECTIVE IMPAIRMENTS: Abnormal gait, decreased activity tolerance, decreased balance, decreased endurance, decreased mobility, difficulty walking, decreased ROM, decreased strength, and pain.   ACTIVITY LIMITATIONS: carrying, lifting, bending, sitting, standing, squatting, stairs, transfers, and dressing  PARTICIPATION LIMITATIONS: meal prep, cleaning, community activity, and occupation  PERSONAL FACTORS: Profession are also affecting patient's functional outcome.   REHAB POTENTIAL: Good  CLINICAL DECISION MAKING: Stable/uncomplicated  EVALUATION COMPLEXITY: Low   GOALS: Goals reviewed with patient? No  SHORT TERM GOALS: Target date: 04/26/2023  Patient will demonstrate evidence of independence with individualized HEP and will report compliance for at least 3 days per week for optimized progression towards remaining therapy goals. Baseline:  Goal status: INITIAL  2.  Patient will report a decrease in pain level during community ambulation by at least 2 points for improved quality of life. Baseline: 7/10 Goal status: INITIAL     LONG TERM GOALS: Target date: 05/17/2023  Pt will demonstrate a an increase of at least 9 points on the LEFS for improved performance of community ambulation and ADL. Baseline: 39/80 Goal status: INITIAL  2.  Pt will improve 2 MWT by 100 feet in order to demonstrate improved functional ambulatory capacity in community setting.  Baseline: 378 feet (age norm=600 feet) Goal status: INITIAL  3.  Pt will demonstrate at least 4-/5 MMT for right ankle for increased strength during ADL and community ambulation. Baseline: See above Goal  status: INITIAL  4.  Pt will report a pain level of at most 3/10 with ADLs and community ambulation for improved quality of life. Baseline: 7/10 Goal status: INITIAL  PLAN:  PT FREQUENCY: 2x/week  PT DURATION: 6 weeks  PLANNED INTERVENTIONS: 97110-Therapeutic exercises, 97530- Therapeutic activity, [id number]- Neuromuscular re-education, 97535- Self Care, [id number]- Manual therapy, [phone]- Gait training, Patient/Family education, Balance training, and Stair training  PLAN FOR NEXT SESSION: Progress HEP where necessary, continue RLE strength, balance, and initiate foot intrinsic musculature training.   Luz Lex, PT, DPT [facility] Office: [phone]   Managed Medicaid Authorization Request  Visit Dx Codes: [phone], [phone]  Functional Tool Score: LEFS:  39/80  For all possible CPT codes, reference the Planned Interventions line above.     Check all conditions that are expected to impact treatment: {Conditions expected to impact treatment:None of these apply   If treatment provided at initial evaluation, no treatment charged due to lack of authorization.

## 2023-04-13 ENCOUNTER — Ambulatory Visit (HOSPITAL_COMMUNITY)

## 2023-04-13 ENCOUNTER — Encounter (HOSPITAL_COMMUNITY): Payer: Self-pay

## 2023-04-13 DIAGNOSIS — M79671 Pain in right foot: Secondary | ICD-10-CM | POA: Diagnosis not present

## 2023-04-13 DIAGNOSIS — M25671 Stiffness of right ankle, not elsewhere classified: Secondary | ICD-10-CM | POA: Diagnosis not present

## 2023-04-13 DIAGNOSIS — M7671 Peroneal tendinitis, right leg: Secondary | ICD-10-CM | POA: Diagnosis not present

## 2023-04-13 NOTE — Therapy (Signed)
 OUTPATIENT PHYSICAL THERAPY LOWER EXTREMITY TREATMENT   Patient Name: [name] MRN: [id number] DOB:May 24, 1980, 43 y.o., female Today's Date: 04/13/2023  END OF SESSION:  PT End of Session - 04/13/23 1659     Visit Number 2    Number of Visits 13    Date for PT Re-Evaluation 05/17/23    Authorization Type [employer]    Authorization - Visit Number 2    Authorization - Number of Visits 12    Progress Note Due on Visit 7    PT Start Time 1601    PT Stop Time 1644    PT Time Calculation (min) 43 min    Activity Tolerance Patient tolerated treatment well;No increased pain    Behavior During Therapy [facility] for tasks assessed/performed              Past Medical History:  Diagnosis Date   Anxiety    Hoarseness, chronic 03/12/2013   No pertinent past medical history    Past Surgical History:  Procedure Laterality Date   ABDOMINAL HYSTERECTOMY  2013   BIOPSY  04/01/2021   Procedure: BIOPSY;  Surgeon: Dolores Frame, MD;  [facility];  Service: Gastroenterology;;   CESAREAN SECTION  2012   twins   ESOPHAGOGASTRODUODENOSCOPY N/A 05/02/2013   Procedure: ESOPHAGOGASTRODUODENOSCOPY (EGD);  Surgeon: Malissa Hippo, MD;  [facility];  Service: Endoscopy;  Laterality: N/A;  230   ESOPHAGOGASTRODUODENOSCOPY (EGD) WITH PROPOFOL N/A 04/01/2021   Procedure: ESOPHAGOGASTRODUODENOSCOPY (EGD) WITH PROPOFOL;  Surgeon: Dolores Frame, MD;  [facility];  Service: Gastroenterology;  Laterality: N/A;  245   LAPAROSCOPIC ASSISTED VAGINAL HYSTERECTOMY  12/13/2011   Procedure: LAPAROSCOPIC ASSISTED VAGINAL HYSTERECTOMY;  Surgeon: Turner Daniels, MD;  Location: [facility];  Service: Gynecology;  Laterality: N/A;   SAVORY DILATION  04/01/2021   Procedure: SAVORY DILATION;  Surgeon: Marguerita Merles, Reuel Boom, MD;  [facility];  Service: Gastroenterology;;   TONSILLECTOMY     WISDOM TOOTH EXTRACTION     Patient Active Problem List    Diagnosis Date Noted   Vitamin D deficiency 06/02/2022   Breast lump on left side at 6 o'clock position 03/12/2022   Breast pain, left 03/12/2022   Encounter for annual physical exam 11/17/2021   Celiac disease 07/13/2021   IBS (irritable bowel syndrome) 03/12/2021   Constipation 02/03/2021   Degenerative tear of lateral meniscus of right knee 07/06/2019   Snoring 07/21/2015   GERD (gastroesophageal reflux disease) 03/12/2013   Seasonal and perennial allergic rhinitis 03/12/2013   Chronic hoarseness 03/12/2013   Obesity (BMI 30.0-34.9) 06/06/2008    PCP: Kerri Perches, MD   REFERRING PROVIDER: Edwin Cap, DPM  REFERRING DIAG: [phone] (ICD-10-CM) - Peroneal tendinitis, right  THERAPY DIAG:  Pain in right foot  Stiffness of right ankle joint  Rationale for Evaluation and Treatment: Rehabilitation  ONSET DATE: Summer 2024   SUBJECTIVE:   SUBJECTIVE STATEMENT: Pt states she has been sore from the HEP, 7-8/10 pain report. Pt reports alternating ice and heat on R foot, heat feels better.  Pt reports 7/10 pain, right foot. 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 Foot cramps up every once in a while. Pt reports wearing boot for about a month and a half without any progress. Pt also reports having tried injection therapy which was not successful.  PERTINENT HISTORY: TFC -right foot pain fourth and fifth metatarsal base  and peroneal insertion suspect stress fracture of metatarsal, possible bursitis did not respond well to injection therapy Has had corticosteroid injections pt states these did not touch her pain. PAIN:  Are you having pain? Yes: NPRS scale: 7/10 Pain location: sole around base of fifth, dorsum of lateral foot. Pain description: stabbing, cramping pain Aggravating factors: movement, walking, standing for a long period of time Relieving factors: rest, elevation Pain does increase, just depends on  activity level PRECAUTIONS: None   WEIGHT BEARING RESTRICTIONS: No  FALLS:  Has patient fallen in last 6 months? No  OCCUPATION: works with Public affairs consultant, Pre K teacher   PLOF: Independent  PATIENT GOALS: be able to move around with out being in pain, be able to stand for extended periods  NEXT MD VISIT: towards the end of month (March)  OBJECTIVE:  Note: Objective measures were completed at Evaluation unless otherwise noted.  DIAGNOSTIC FINDINGS: MRI of R foot CLINICAL DATA:  Chronic foot pain and numbness since last summer. Pain mostly around the base of the fourth and fifth metatarsals. No injury or prior surgery.   EXAM: MRI OF THE RIGHT FOREFOOT WITHOUT CONTRAST   TECHNIQUE: Multiplanar, multisequence MR imaging of the right foot was performed. No intravenous contrast was administered.   COMPARISON:  Right foot x-rays dated December 07, 2022.   FINDINGS: Bones/Joint/Cartilage   No marrow signal abnormality. No fracture or dislocation. Joint spaces are preserved. No joint effusion.   Ligaments   Collateral ligaments are intact.  Lisfranc ligament is intact.   Muscles and Tendons Flexor and extensor tendons are intact. No tenosynovitis. No muscle edema or atrophy.   Soft tissue No fluid collection or hematoma.  No soft tissue mass.   IMPRESSION: 1. Normal MRI of the right foot.     Electronically Signed   By: Obie Dredge M.D.   On: 02/12/2023 20:08  PATIENT SURVEYS:  LEFS 39/80  COGNITION: Overall cognitive status: Within functional limits for tasks assessed     SENSATION: Pt states she is able to feel it fine but will go numb occasionally if she sits in one position too long.    PALPATION: Tender to palpation   LOWER EXTREMITY ROM:  Active ROM Right eval Left eval  Hip flexion    Hip extension    Hip abduction    Hip adduction    Hip internal rotation    Hip external rotation    Knee flexion    Knee extension    Ankle dorsiflexion 6,  pain 15  Ankle plantarflexion 45, pain 62  Ankle inversion 18, pain 20  Ankle eversion 20, pain 20   (Blank rows = not tested)  LOWER EXTREMITY MMT:  MMT Right eval Left eval  Hip flexion    Hip extension    Hip abduction    Hip adduction    Hip internal rotation    Hip external rotation    Knee flexion    Knee extension    Ankle dorsiflexion 3+, pain 5  Ankle plantarflexion 4-, pain same every rep 5  Ankle inversion 3, pain 5  Ankle eversion 3-, pain 5   (Blank rows = not tested)   FUNCTIONAL TESTS:  2 minute walk test: 378 feet SLS: L: 30s     R:  8s, 21s muscle fatigue GAIT: Distance walked: 378 feet Assistive device utilized: None Level of assistance: Complete Independence Comments: Pt demonstrates antalgic gait with decreased speed, and stride length and favoring of RLE.  TREATMENT DATE:  04/13/2023   Manual Therapy: -STM to superior lateral foot, sole of foot to medial arch - Foot supination/pronation mobilization 10 reps each direction, grade II -Inter-metatarsal flexion/extension 10 reps each direction, grade II/III  Therapeutic Exercise: -Seated heel and toe raises, 20 reps each direction -Seated toe raises and curls, 3 sets of 10 reps -YTB resisted PF, DF, Eversion, Inversion pt cued for pain free ROM, 1 set 10 reps each direction -Ankle circles, 20 reps each direction   04/05/2023  Therapeutic Exercise: -Heel raises 1 set of 15 bilaterally   PATIENT EDUCATION:  Education details: Pt was educated on findings of PT evaluation, prognosis, frequency of therapy visits and rationale, attendance policy, and HEP if given.   Person educated: Patient Education method: Explanation, Demonstration, Tactile cues, and Handouts Education comprehension: verbalized understanding  HOME EXERCISE PROGRAM: Access Code: [email or web address] URL:  [email or web address] Date: 04/05/2023 Prepared by: Luz Lex  Exercises - Standing Heel Raise  - 1 x daily - 7 x weekly - 3 sets - 10 reps - Ankle Alphabet in Elevation  - 1 x daily - 7 x weekly - 3 sets - 10 reps Note: Unable to give pt handout following initial evaluation but was able to access HEP via medbridge app.  ASSESSMENT:  CLINICAL IMPRESSION: PT initiated with manual therapy due to soreness reported following last session and during HEP, pt responded well. Pt seems very motivated for therapy but due to work schedule is having some difficulty finding available appointments. Pt demonstrated ability to initiate resisted ankle exercises today with yellow theraband with pain only increased during eversion movement. Pt continues to describe pain location on the outside ball of her foot, from dorsum of the foot around the 5th metatarsal to the medial arch. Pt does report slight decrease in pain level to 6/10 after session today. Patient would continue to benefit from skilled physical therapy for increased pain free R ankle and foot ROM, improved strength of R ankle and foot intrinsic musculature for increased performance with ambulation, decreased pain during ADLs and continued progress towards therapy goals.   Eval: Patient is a 43 y.o. female who was seen today for physical therapy evaluation and treatment for M76.71 (ICD-10-CM) - Peroneal tendinitis, right. Pt demonstrates decreased gait speed and pattern (decreased speed and stride lengths). Pt is motivated and is willing to comply with all suggested therapy to the best of her ability. Pt demonstrates decreased ROM and strength on RLE compared to the LLE due to pain during assessment. Patient would benefit from skilled physical therapy to decrease pain of RLE, increase R ankle ROM and strength to improve quality of life, community ambulation and progression towards therapy goals.   OBJECTIVE IMPAIRMENTS: Abnormal gait,  decreased activity tolerance, decreased balance, decreased endurance, decreased mobility, difficulty walking, decreased ROM, decreased strength, and pain.   ACTIVITY LIMITATIONS: carrying, lifting, bending, sitting, standing, squatting, stairs, transfers, and dressing  PARTICIPATION LIMITATIONS: meal prep, cleaning, community activity, and occupation  PERSONAL FACTORS: Profession are also affecting patient's functional outcome.   REHAB POTENTIAL: Good  CLINICAL DECISION MAKING: Stable/uncomplicated  EVALUATION COMPLEXITY: Low   GOALS: Goals reviewed with patient? No  SHORT TERM GOALS: Target date: 04/26/2023  Patient will demonstrate evidence of independence with individualized HEP and will report compliance for at least 3 days per week for optimized progression towards remaining therapy goals. Baseline:  Goal status: INITIAL  2.  Patient will report a decrease in pain level during community ambulation by at least 2 points for  improved quality of life. Baseline: 7/10 Goal status: INITIAL     LONG TERM GOALS: Target date: 05/17/2023  Pt will demonstrate a an increase of at least 9 points on the LEFS for improved performance of community ambulation and ADL. Baseline: 39/80 Goal status: INITIAL  2.  Pt will improve 2 MWT by 100 feet in order to demonstrate improved functional ambulatory capacity in community setting.  Baseline: 378 feet (age norm=600 feet) Goal status: INITIAL  3.  Pt will demonstrate at least 4-/5 MMT for right ankle for increased strength during ADL and community ambulation. Baseline: See above Goal status: INITIAL  4.  Pt will report a pain level of at most 3/10 with ADLs and community ambulation for improved quality of life. Baseline: 7/10 Goal status: INITIAL  PLAN:  PT FREQUENCY: 2x/week  PT DURATION: 6 weeks  PLANNED INTERVENTIONS: 97110-Therapeutic exercises, 97530- Therapeutic activity, 97112- Neuromuscular re-education, 97535- Self Care,  97140- Manual therapy, [phone]- Gait training, Patient/Family education, Balance training, and Stair training  PLAN FOR NEXT SESSION: Continue manual therapy and mobilizations if indicated, Progress HEP where necessary, continue RLE strength, balance, and initiate foot intrinsic musculature training.   Luz Lex, PT, DPT [facility] Office: [phone]

## 2023-04-25 ENCOUNTER — Encounter: Payer: Self-pay | Admitting: Podiatry

## 2023-04-25 ENCOUNTER — Ambulatory Visit: Payer: BC Managed Care – PPO | Admitting: Podiatry

## 2023-04-25 DIAGNOSIS — Z5321 Procedure and treatment not carried out due to patient leaving prior to being seen by health care provider: Secondary | ICD-10-CM

## 2023-04-26 ENCOUNTER — Encounter: Payer: Self-pay | Admitting: Podiatry

## 2023-04-26 NOTE — Progress Notes (Signed)
 I was running behind schedule and patient had to leave due to family emergency before evaluation by me but after rooming by medical assistant.  MyChart message sent, I called as well but could not leave a voicemail.

## 2023-04-27 ENCOUNTER — Ambulatory Visit (HOSPITAL_COMMUNITY)

## 2023-04-29 ENCOUNTER — Ambulatory Visit (HOSPITAL_COMMUNITY)

## 2023-04-29 ENCOUNTER — Encounter (HOSPITAL_COMMUNITY): Payer: Self-pay

## 2023-04-29 DIAGNOSIS — M25671 Stiffness of right ankle, not elsewhere classified: Secondary | ICD-10-CM

## 2023-04-29 DIAGNOSIS — M79671 Pain in right foot: Secondary | ICD-10-CM

## 2023-04-29 DIAGNOSIS — M7671 Peroneal tendinitis, right leg: Secondary | ICD-10-CM | POA: Diagnosis not present

## 2023-04-29 NOTE — Therapy (Signed)
 OUTPATIENT PHYSICAL THERAPY LOWER EXTREMITY TREATMENT   Patient Name: [name] MRN: [id number] DOB:[birth year], 43 y.o., female Today's Date: 04/29/2023  END OF SESSION:  PT End of Session - 04/29/23 1512     Visit Number 3    Number of Visits 13    Date for PT Re-Evaluation 05/17/23    Authorization Type BLUE [employer]    Authorization Time Period no auth required, visit limit 60 PT/OT combined hard max    Authorization - Visit Number 3    Authorization - Number of Visits 12    Progress Note Due on Visit 7    PT Start Time 1515    PT Stop Time 1604    PT Time Calculation (min) 49 min    Activity Tolerance Patient tolerated treatment well;No increased pain    Behavior During Therapy [facility] for tasks assessed/performed              Past Medical History:  Diagnosis Date   Anxiety    Hoarseness, chronic 03/12/2013   No pertinent past medical history    Past Surgical History:  Procedure Laterality Date   ABDOMINAL HYSTERECTOMY  2013   BIOPSY  04/01/2021   Procedure: BIOPSY;  Surgeon: Dolores Frame, MD;  Location: [facility];  Service: Gastroenterology;;   CESAREAN SECTION  2012   twins   ESOPHAGOGASTRODUODENOSCOPY N/A 05/02/2013   Procedure: ESOPHAGOGASTRODUODENOSCOPY (EGD);  Surgeon: Malissa Hippo, MD;  Location: [facility];  Service: Endoscopy;  Laterality: N/A;  230   ESOPHAGOGASTRODUODENOSCOPY (EGD) WITH PROPOFOL N/A 04/01/2021   Procedure: ESOPHAGOGASTRODUODENOSCOPY (EGD) WITH PROPOFOL;  Surgeon: Dolores Frame, MD;  Location: [facility];  Service: Gastroenterology;  Laterality: N/A;  245   LAPAROSCOPIC ASSISTED VAGINAL HYSTERECTOMY  12/13/2011   Procedure: LAPAROSCOPIC ASSISTED VAGINAL HYSTERECTOMY;  Surgeon: Turner Daniels, MD;  Location: [facility];  Service: Gynecology;  Laterality: N/A;   SAVORY DILATION  04/01/2021   Procedure: SAVORY DILATION;  Surgeon: Marguerita Merles, Reuel Boom, MD;  Location: [facility];  Service:  Gastroenterology;;   TONSILLECTOMY     WISDOM TOOTH EXTRACTION     Patient Active Problem List   Diagnosis Date Noted   Vitamin D deficiency 06/02/2022   Breast lump on left side at 6 o'clock position 03/12/2022   Breast pain, left 03/12/2022   Encounter for annual physical exam 11/17/2021   Celiac disease 07/13/2021   IBS (irritable bowel syndrome) 03/12/2021   Constipation 02/03/2021   Degenerative tear of lateral meniscus of right knee 07/06/2019   Snoring 07/21/2015   GERD (gastroesophageal reflux disease) 03/12/2013   Seasonal and perennial allergic rhinitis 03/12/2013   Chronic hoarseness 03/12/2013   Obesity (BMI 30.0-34.9) 06/06/2008    PCP: Kerri Perches, MD   REFERRING PROVIDER: Edwin Cap, DPM  REFERRING DIAG: [phone] (ICD-10-CM) - Peroneal tendinitis, right  THERAPY DIAG:  Pain in right foot  Stiffness of right ankle joint  Rationale for Evaluation and Treatment: Rehabilitation  ONSET DATE: Summer 2024   SUBJECTIVE:   SUBJECTIVE STATEMENT: Pt stated Rt foot constant pain.  Has been compliant without HEP.   Pt reports 7/10 pain, right foot. 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 Foot cramps up every once in a while. Pt reports wearing boot for about a month and a half without any progress. Pt also reports having tried injection therapy which was not successful.  PERTINENT HISTORY: TFC -right foot pain  fourth and fifth metatarsal base and peroneal insertion suspect stress fracture of metatarsal, possible bursitis did not respond well to injection therapy Has had corticosteroid injections pt states these did not touch her pain. PAIN:  Are you having pain? Yes: NPRS scale: 7/10 Pain location: sole around base of fifth, dorsum of lateral foot. Pain description: stabbing, cramping pain Aggravating factors: movement, walking, standing for a long period of time Relieving factors: rest,  elevation Pain does increase, just depends on activity level PRECAUTIONS: None   WEIGHT BEARING RESTRICTIONS: No  FALLS:  Has patient fallen in last 6 months? No  OCCUPATION: works with [employer], Pre K teacher   PLOF: Independent  PATIENT GOALS: be able to move around with out being in pain, be able to stand for extended periods  NEXT MD VISIT: towards the end of month (March)  OBJECTIVE:  Note: Objective measures were completed at Evaluation unless otherwise noted.  DIAGNOSTIC FINDINGS: MRI of R foot CLINICAL DATA:  Chronic foot pain and numbness since last summer. Pain mostly around the base of the fourth and fifth metatarsals. No injury or prior surgery.   EXAM: MRI OF THE RIGHT FOREFOOT WITHOUT CONTRAST   TECHNIQUE: Multiplanar, multisequence MR imaging of the right foot was performed. No intravenous contrast was administered.   COMPARISON:  Right foot x-rays dated December 07, 2022.   FINDINGS: Bones/Joint/Cartilage   No marrow signal abnormality. No fracture or dislocation. Joint spaces are preserved. No joint effusion.   Ligaments   Collateral ligaments are intact.  Lisfranc ligament is intact.   Muscles and Tendons Flexor and extensor tendons are intact. No tenosynovitis. No muscle edema or atrophy.   Soft tissue No fluid collection or hematoma.  No soft tissue mass.   IMPRESSION: 1. Normal MRI of the right foot.     Electronically Signed   By: Obie Dredge M.D.   On: 02/12/2023 20:08  PATIENT SURVEYS:  LEFS 39/80  COGNITION: Overall cognitive status: Within functional limits for tasks assessed     SENSATION: Pt states she is able to feel it fine but will go numb occasionally if she sits in one position too long.    PALPATION: Tender to palpation   LOWER EXTREMITY ROM:  Active ROM Right eval Left eval  Hip flexion    Hip extension    Hip abduction    Hip adduction    Hip internal rotation    Hip external rotation    Knee  flexion    Knee extension    Ankle dorsiflexion 6, pain 15  Ankle plantarflexion 45, pain 62  Ankle inversion 18, pain 20  Ankle eversion 20, pain 20   (Blank rows = not tested)  LOWER EXTREMITY MMT:  MMT Right eval Left eval  Hip flexion    Hip extension    Hip abduction    Hip adduction    Hip internal rotation    Hip external rotation    Knee flexion    Knee extension    Ankle dorsiflexion 3+, pain 5  Ankle plantarflexion 4-, pain same every rep 5  Ankle inversion 3, pain 5  Ankle eversion 3-, pain 5   (Blank rows = not tested)   FUNCTIONAL TESTS:  2 minute walk test: 378 feet SLS: L: 30s     R:  8s, 21s muscle fatigue GAIT: Distance walked: 378 feet Assistive device utilized: None Level of assistance: Complete Independence Comments: Pt demonstrates antalgic gait with decreased speed, and stride length and favoring  of RLE.                                                                                                                                TREATMENT DATE:  04/29/23: Therapeutic Exercises Seated position -Towel crunch 1 time -10 Marble pick up -Isometric contraction PF, DF, In, Env 10x 5" each -Inversion/Eversion with towel slides  Manual therapy: STM to superior lateral foot, sole of foot to medial arch PROM all directions  Self care: Educated benefits with compression garments for edema control Measurements taken and given handout for ETI  04/13/2023   Manual Therapy: -STM to superior lateral foot, sole of foot to medial arch - Foot supination/pronation mobilization 10 reps each direction, grade II -Inter-metatarsal flexion/extension 10 reps each direction, grade II/III  Therapeutic Exercise: -Seated heel and toe raises, 20 reps each direction -Seated toe raises and curls, 3 sets of 10 reps -YTB resisted PF, DF, Eversion, Inversion pt cued for pain free ROM, 1 set 10 reps each direction -Ankle circles, 20 reps each direction   04/05/2023   Therapeutic Exercise: -Heel raises 1 set of 15 bilaterally   PATIENT EDUCATION:  Education details: Pt was educated on findings of PT evaluation, prognosis, frequency of therapy visits and rationale, attendance policy, and HEP if given.   Person educated: Patient Education method: Explanation, Demonstration, Tactile cues, and Handouts Education comprehension: verbalized understanding  HOME EXERCISE PROGRAM: Access Code: [email or web address] URL: [email or web address] Date: 04/05/2023 Prepared by: Luz Lex  Exercises - Standing Heel Raise  - 1 x daily - 7 x weekly - 3 sets - 10 reps - Ankle Alphabet in Elevation  - 1 x daily - 7 x weekly - 3 sets - 10 reps Note: Unable to give pt handout following initial evaluation but was able to access HEP via medbridge app.  04/29/23: Access Code: [email or web address] URL: [email or web address] Date: 04/29/2023 Prepared by: Becky Sax  Exercises - Standing Heel Raise  - 1 x daily - 7 x weekly - 3 sets - 10 reps - Ankle Alphabet in Elevation  - 1 x daily - 7 x weekly - 3 sets - 10 reps - Isometric Ankle Dorsiflexion and Plantarflexion  - 1 x daily - 7 x weekly - 3 sets - 10 reps - 5" hold - Isometric Ankle Inversion  - 1 x daily - 7 x weekly - 3 sets - 10 reps - 5" hold - Towel Scrunches  - 1 x daily - 7 x weekly - 3 sets - 10 reps  ASSESSMENT:  CLINICAL IMPRESSION: Session focus with ankle mobility and intrinsic strengthening.  Pt presents with limit ROM with dorsiflexion and great toe extension as well as pain/easy fatigue due to weakness.  Added marble pick up, arch formation and isometric contraction exercises for strengthening and mobility.  Pt tolerated well to new exercises but continues to limited by pain.  Noted edema present and pt stands to teach during the day.  Educated  benefits with compression socks for edema control.  Measurements taken and given ETI printout.  EOS with STM and ankle mobility with reports of  relief.    Eval: Patient is a 43 y.o. female who was seen today for physical therapy evaluation and treatment for M76.71 (ICD-10-CM) - Peroneal tendinitis, right. Pt demonstrates decreased gait speed and pattern (decreased speed and stride lengths). Pt is motivated and is willing to comply with all suggested therapy to the best of her ability. Pt demonstrates decreased ROM and strength on RLE compared to the LLE due to pain during assessment. Patient would benefit from skilled physical therapy to decrease pain of RLE, increase R ankle ROM and strength to improve quality of life, community ambulation and progression towards therapy goals.   OBJECTIVE IMPAIRMENTS: Abnormal gait, decreased activity tolerance, decreased balance, decreased endurance, decreased mobility, difficulty walking, decreased ROM, decreased strength, and pain.   ACTIVITY LIMITATIONS: carrying, lifting, bending, sitting, standing, squatting, stairs, transfers, and dressing  PARTICIPATION LIMITATIONS: meal prep, cleaning, community activity, and occupation  PERSONAL FACTORS: Profession are also affecting patient's functional outcome.   REHAB POTENTIAL: Good  CLINICAL DECISION MAKING: Stable/uncomplicated  EVALUATION COMPLEXITY: Low   GOALS: Goals reviewed with patient? No  SHORT TERM GOALS: Target date: 04/26/2023  Patient will demonstrate evidence of independence with individualized HEP and will report compliance for at least 3 days per week for optimized progression towards remaining therapy goals. Baseline:  Goal status: INITIAL  2.  Patient will report a decrease in pain level during community ambulation by at least 2 points for improved quality of life. Baseline: 7/10 Goal status: INITIAL     LONG TERM GOALS: Target date: 05/17/2023  Pt will demonstrate a an increase of at least 9 points on the LEFS for improved performance of community ambulation and ADL. Baseline: 39/80 Goal status: INITIAL  2.  Pt  will improve 2 MWT by 100 feet in order to demonstrate improved functional ambulatory capacity in community setting.  Baseline: 378 feet (age norm=600 feet) Goal status: INITIAL  3.  Pt will demonstrate at least 4-/5 MMT for right ankle for increased strength during ADL and community ambulation. Baseline: See above Goal status: INITIAL  4.  Pt will report a pain level of at most 3/10 with ADLs and community ambulation for improved quality of life. Baseline: 7/10 Goal status: INITIAL  PLAN:  PT FREQUENCY: 2x/week  PT DURATION: 6 weeks  PLANNED INTERVENTIONS: 97110-Therapeutic exercises, 97530- Therapeutic activity, 97112- Neuromuscular re-education, 97535- Self Care, [id number]- Manual therapy, [phone]- Gait training, Patient/Family education, Balance training, and Stair training  PLAN FOR NEXT SESSION: Continue manual therapy and mobilizations if indicated, Progress HEP where necessary, continue RLE strength, balance, and initiate foot intrinsic musculature training.  Becky Sax, LPTA/CLT; CBIS [phone]  Juel Burrow, PTA 04/29/2023, 4:15 PM

## 2023-05-02 ENCOUNTER — Encounter (HOSPITAL_COMMUNITY): Admitting: Physical Therapy

## 2023-05-03 ENCOUNTER — Telehealth (HOSPITAL_COMMUNITY): Payer: Self-pay | Admitting: Physical Therapy

## 2023-05-03 NOTE — Telephone Encounter (Signed)
 Pt did not show for appointment on 05/02/23.  Therapist was unable to reach patient.   Lurena Nida, PTA/CLT [facility] Ph: [phone]

## 2023-05-04 ENCOUNTER — Ambulatory Visit (HOSPITAL_COMMUNITY): Attending: Podiatry

## 2023-05-04 ENCOUNTER — Encounter (HOSPITAL_COMMUNITY): Payer: Self-pay

## 2023-05-04 DIAGNOSIS — M25671 Stiffness of right ankle, not elsewhere classified: Secondary | ICD-10-CM | POA: Diagnosis not present

## 2023-05-04 DIAGNOSIS — M79671 Pain in right foot: Secondary | ICD-10-CM | POA: Insufficient documentation

## 2023-05-04 NOTE — Therapy (Signed)
 OUTPATIENT PHYSICAL THERAPY LOWER EXTREMITY TREATMENT   Patient Name: [name] MRN: [id number] DOB:[birth year], 43 y.o., female Today's Date: 05/04/2023  END OF SESSION:  PT End of Session - 05/04/23 1519     Visit Number 4    Number of Visits 13    Date for PT Re-Evaluation 05/17/23    Authorization Type BLUE CROSS [employer]    Authorization Time Period no auth required, visit limit 60 PT/OT combined hard max    Authorization - Visit Number 4    Authorization - Number of Visits 12    Progress Note Due on Visit 7    PT Start Time 1519    PT Stop Time 1559    PT Time Calculation (min) 40 min    Activity Tolerance Patient tolerated treatment well;No increased pain    Behavior During Therapy [facility] for tasks assessed/performed              Past Medical History:  Diagnosis Date   Anxiety    Hoarseness, chronic 03/12/2013   No pertinent past medical history    Past Surgical History:  Procedure Laterality Date   ABDOMINAL HYSTERECTOMY  2013   BIOPSY  04/01/2021   Procedure: BIOPSY;  Surgeon: Dolores Frame, MD;  Location: [facility];  Service: Gastroenterology;;   CESAREAN SECTION  2012   twins   ESOPHAGOGASTRODUODENOSCOPY N/A 05/02/2013   Procedure: ESOPHAGOGASTRODUODENOSCOPY (EGD);  Surgeon: Malissa Hippo, MD;  Location: [facility];  Service: Endoscopy;  Laterality: N/A;  230   ESOPHAGOGASTRODUODENOSCOPY (EGD) WITH PROPOFOL N/A 04/01/2021   Procedure: ESOPHAGOGASTRODUODENOSCOPY (EGD) WITH PROPOFOL;  Surgeon: Dolores Frame, MD;  Location: [facility];  Service: Gastroenterology;  Laterality: N/A;  245   LAPAROSCOPIC ASSISTED VAGINAL HYSTERECTOMY  12/13/2011   Procedure: LAPAROSCOPIC ASSISTED VAGINAL HYSTERECTOMY;  Surgeon: Turner Daniels, MD;  Location: [facility];  Service: Gynecology;  Laterality: N/A;   SAVORY DILATION  04/01/2021   Procedure: SAVORY DILATION;  Surgeon: Marguerita Merles, Reuel Boom, MD;  Location: [facility];  Service:  Gastroenterology;;   TONSILLECTOMY     WISDOM TOOTH EXTRACTION     Patient Active Problem List   Diagnosis Date Noted   Vitamin D deficiency 06/02/2022   Breast lump on left side at 6 o'clock position 03/12/2022   Breast pain, left 03/12/2022   Encounter for annual physical exam 11/17/2021   Celiac disease 07/13/2021   IBS (irritable bowel syndrome) 03/12/2021   Constipation 02/03/2021   Degenerative tear of lateral meniscus of right knee 07/06/2019   Snoring 07/21/2015   GERD (gastroesophageal reflux disease) 03/12/2013   Seasonal and perennial allergic rhinitis 03/12/2013   Chronic hoarseness 03/12/2013   Obesity (BMI 30.0-34.9) 06/06/2008    PCP: Kerri Perches, MD   REFERRING PROVIDER: Edwin Cap, DPM  REFERRING DIAG: [phone] (ICD-10-CM) - Peroneal tendinitis, right  THERAPY DIAG:  Pain in right foot  Stiffness of right ankle joint  Rationale for Evaluation and Treatment: Rehabilitation  ONSET DATE: Summer 2024   SUBJECTIVE:   SUBJECTIVE STATEMENT: Reports increased Rt foot pain today, pain scale 8/10.  Pt wearing compression socks today.  Pt reports 7/10 pain, right foot. 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 Foot cramps up every once in a while. Pt reports wearing boot for about a month and a half without any progress. Pt also reports having tried injection therapy which was not successful.  PERTINENT HISTORY: TFC -right  foot pain fourth and fifth metatarsal base and peroneal insertion suspect stress fracture of metatarsal, possible bursitis did not respond well to injection therapy Has had corticosteroid injections pt states these did not touch her pain. PAIN:  Are you having pain? Yes: NPRS scale: 7/10 Pain location: sole around base of fifth, dorsum of lateral foot. Pain description: stabbing, cramping pain Aggravating factors: movement, walking, standing for a long period of time Relieving  factors: rest, elevation Pain does increase, just depends on activity level PRECAUTIONS: None   WEIGHT BEARING RESTRICTIONS: No  FALLS:  Has patient fallen in last 6 months? No  OCCUPATION: works with [employer], Pre K teacher   PLOF: Independent  PATIENT GOALS: be able to move around with out being in pain, be able to stand for extended periods  NEXT MD VISIT: towards the end of month (March)  OBJECTIVE:  Note: Objective measures were completed at Evaluation unless otherwise noted.  DIAGNOSTIC FINDINGS: MRI of R foot CLINICAL DATA:  Chronic foot pain and numbness since last summer. Pain mostly around the base of the fourth and fifth metatarsals. No injury or prior surgery.   EXAM: MRI OF THE RIGHT FOREFOOT WITHOUT CONTRAST   TECHNIQUE: Multiplanar, multisequence MR imaging of the right foot was performed. No intravenous contrast was administered.   COMPARISON:  Right foot x-rays dated December 07, 2022.   FINDINGS: Bones/Joint/Cartilage   No marrow signal abnormality. No fracture or dislocation. Joint spaces are preserved. No joint effusion.   Ligaments   Collateral ligaments are intact.  Lisfranc ligament is intact.   Muscles and Tendons Flexor and extensor tendons are intact. No tenosynovitis. No muscle edema or atrophy.   Soft tissue No fluid collection or hematoma.  No soft tissue mass.   IMPRESSION: 1. Normal MRI of the right foot.     Electronically Signed   By: Obie Dredge M.D.   On: 02/12/2023 20:08  PATIENT SURVEYS:  LEFS 39/80  COGNITION: Overall cognitive status: Within functional limits for tasks assessed     SENSATION: Pt states she is able to feel it fine but will go numb occasionally if she sits in one position too long.    PALPATION: Tender to palpation   LOWER EXTREMITY ROM:  Active ROM Right eval Left eval  Hip flexion    Hip extension    Hip abduction    Hip adduction    Hip internal rotation    Hip external rotation     Knee flexion    Knee extension    Ankle dorsiflexion 6, pain 15  Ankle plantarflexion 45, pain 62  Ankle inversion 18, pain 20  Ankle eversion 20, pain 20   (Blank rows = not tested)  LOWER EXTREMITY MMT:  MMT Right eval Left eval  Hip flexion    Hip extension    Hip abduction    Hip adduction    Hip internal rotation    Hip external rotation    Knee flexion    Knee extension    Ankle dorsiflexion 3+, pain 5  Ankle plantarflexion 4-, pain same every rep 5  Ankle inversion 3, pain 5  Ankle eversion 3-, pain 5   (Blank rows = not tested)   FUNCTIONAL TESTS:  2 minute walk test: 378 feet SLS: L: 30s     R:  8s, 21s muscle fatigue GAIT: Distance walked: 378 feet Assistive device utilized: None Level of assistance: Complete Independence Comments: Pt demonstrates antalgic gait with decreased speed, and stride length  and favoring of RLE.                                                                                                                                TREATMENT DATE:  05/04/23: Therapeutic Exercises Seated position - BAPS L3 DF/PF; Inv/Ev 10x; Cw/CCW 5x each - Plantarflexion 2x 10 3" holds with GTB  Manual: - Decongestive massage for edema control with LE elevated 12 minutes - Kinesiotape for edema control on dorsolateral aspect of foot - Ice massage x 7 minutes    04/29/23: Therapeutic Exercises Seated position -Towel crunch 1 time -10 Marble pick up -Isometric contraction PF, DF, In, Env 10x 5" each -Inversion/Eversion with towel slides  Manual therapy: STM to superior lateral foot, sole of foot to medial arch PROM all directions  Self care: Educated benefits with compression garments for edema control Measurements taken and given handout for ETI  04/13/2023   Manual Therapy: -STM to superior lateral foot, sole of foot to medial arch - Foot supination/pronation mobilization 10 reps each direction, grade II -Inter-metatarsal  flexion/extension 10 reps each direction, grade II/III  Therapeutic Exercise: -Seated heel and toe raises, 20 reps each direction -Seated toe raises and curls, 3 sets of 10 reps -YTB resisted PF, DF, Eversion, Inversion pt cued for pain free ROM, 1 set 10 reps each direction -Ankle circles, 20 reps each direction   04/05/2023  Therapeutic Exercise: -Heel raises 1 set of 15 bilaterally   PATIENT EDUCATION:  Education details: Pt was educated on findings of PT evaluation, prognosis, frequency of therapy visits and rationale, attendance policy, and HEP if given.   Person educated: Patient Education method: Explanation, Demonstration, Tactile cues, and Handouts Education comprehension: verbalized understanding  HOME EXERCISE PROGRAM: Access Code: [email or web address] URL: [email or web address] Date: 04/05/2023 Prepared by: Luz Lex  Exercises - Standing Heel Raise  - 1 x daily - 7 x weekly - 3 sets - 10 reps - Ankle Alphabet in Elevation  - 1 x daily - 7 x weekly - 3 sets - 10 reps Note: Unable to give pt handout following initial evaluation but was able to access HEP via medbridge app.  04/29/23: Access Code: [email or web address] URL: [email or web address] Date: 04/29/2023 Prepared by: Becky Sax  Exercises - Standing Heel Raise  - 1 x daily - 7 x weekly - 3 sets - 10 reps - Ankle Alphabet in Elevation  - 1 x daily - 7 x weekly - 3 sets - 10 reps - Isometric Ankle Dorsiflexion and Plantarflexion  - 1 x daily - 7 x weekly - 3 sets - 10 reps - 5" hold - Isometric Ankle Inversion  - 1 x daily - 7 x weekly - 3 sets - 10 reps - 5" hold - Towel Scrunches  - 1 x daily - 7 x weekly - 3 sets - 10 reps  ASSESSMENT:  CLINICAL IMPRESSION: Added BAPS for ankle mobility.  Noted edema lateral aspect of foot and malleoli.  Manual decongestive techniques complete for edema and pain control.  Therapist placed kinesio tape for edema control.  Discussed heat and ice for pain  control, encouraged pt to end on ice for edema control.  EOS with ice massage with reports of pain resolved.    Eval: Patient is a 43 y.o. female who was seen today for physical therapy evaluation and treatment for M76.71 (ICD-10-CM) - Peroneal tendinitis, right. Pt demonstrates decreased gait speed and pattern (decreased speed and stride lengths). Pt is motivated and is willing to comply with all suggested therapy to the best of her ability. Pt demonstrates decreased ROM and strength on RLE compared to the LLE due to pain during assessment. Patient would benefit from skilled physical therapy to decrease pain of RLE, increase R ankle ROM and strength to improve quality of life, community ambulation and progression towards therapy goals.   OBJECTIVE IMPAIRMENTS: Abnormal gait, decreased activity tolerance, decreased balance, decreased endurance, decreased mobility, difficulty walking, decreased ROM, decreased strength, and pain.   ACTIVITY LIMITATIONS: carrying, lifting, bending, sitting, standing, squatting, stairs, transfers, and dressing  PARTICIPATION LIMITATIONS: meal prep, cleaning, community activity, and occupation  PERSONAL FACTORS: Profession are also affecting patient's functional outcome.   REHAB POTENTIAL: Good  CLINICAL DECISION MAKING: Stable/uncomplicated  EVALUATION COMPLEXITY: Low   GOALS: Goals reviewed with patient? No  SHORT TERM GOALS: Target date: 04/26/2023  Patient will demonstrate evidence of independence with individualized HEP and will report compliance for at least 3 days per week for optimized progression towards remaining therapy goals. Baseline:  Goal status: INITIAL  2.  Patient will report a decrease in pain level during community ambulation by at least 2 points for improved quality of life. Baseline: 7/10 Goal status: INITIAL     LONG TERM GOALS: Target date: 05/17/2023  Pt will demonstrate a an increase of at least 9 points on the LEFS for  improved performance of community ambulation and ADL. Baseline: 39/80 Goal status: INITIAL  2.  Pt will improve 2 MWT by 100 feet in order to demonstrate improved functional ambulatory capacity in community setting.  Baseline: 378 feet (age norm=600 feet) Goal status: INITIAL  3.  Pt will demonstrate at least 4-/5 MMT for right ankle for increased strength during ADL and community ambulation. Baseline: See above Goal status: INITIAL  4.  Pt will report a pain level of at most 3/10 with ADLs and community ambulation for improved quality of life. Baseline: 7/10 Goal status: INITIAL  PLAN:  PT FREQUENCY: 2x/week  PT DURATION: 6 weeks  PLANNED INTERVENTIONS: 97110-Therapeutic exercises, 97530- Therapeutic activity, 97112- Neuromuscular re-education, 97535- Self Care, 96045- Manual therapy, [phone]- Gait training, Patient/Family education, Balance training, and Stair training  PLAN FOR NEXT SESSION: Continue manual therapy and mobilizations if indicated, Progress HEP where necessary, continue RLE strength, balance, and initiate foot intrinsic musculature training.  Becky Sax, LPTA/CLT; CBIS [phone]  Juel Burrow, PTA 05/04/2023, 4:13 PM

## 2023-05-09 ENCOUNTER — Encounter (HOSPITAL_COMMUNITY): Payer: Self-pay

## 2023-05-09 ENCOUNTER — Ambulatory Visit (HOSPITAL_COMMUNITY)

## 2023-05-09 DIAGNOSIS — M79671 Pain in right foot: Secondary | ICD-10-CM

## 2023-05-09 DIAGNOSIS — M25671 Stiffness of right ankle, not elsewhere classified: Secondary | ICD-10-CM | POA: Diagnosis not present

## 2023-05-09 NOTE — Therapy (Signed)
 OUTPATIENT PHYSICAL THERAPY LOWER EXTREMITY TREATMENT   Patient Name: [name] MRN: [id number] DOB:09-15-1980, 43 y.o., female Today's Date: 05/09/2023  END OF SESSION:  PT End of Session - 05/09/23 1559     Visit Number 5    Number of Visits 13    Date for PT Re-Evaluation 05/17/23    Authorization Type BLUE [employer]    Authorization Time Period no auth required, visit limit 60 PT/OT combined hard max    Authorization - Visit Number 5    Authorization - Number of Visits 12    Progress Note Due on Visit 7    PT Start Time 1521    PT Stop Time 1559    PT Time Calculation (min) 38 min    Activity Tolerance Patient tolerated treatment well;No increased pain    Behavior During Therapy [facility] for tasks assessed/performed               Past Medical History:  Diagnosis Date   Anxiety    Hoarseness, chronic 03/12/2013   No pertinent past medical history    Past Surgical History:  Procedure Laterality Date   ABDOMINAL HYSTERECTOMY  2013   BIOPSY  04/01/2021   Procedure: BIOPSY;  Surgeon: Dolores Frame, MD;  [facility]: [facility];  Service: Gastroenterology;;   CESAREAN SECTION  2012   twins   ESOPHAGOGASTRODUODENOSCOPY N/A 05/02/2013   Procedure: ESOPHAGOGASTRODUODENOSCOPY (EGD);  Surgeon: Malissa Hippo, MD;  Location: [facility];  Service: Endoscopy;  Laterality: N/A;  230   ESOPHAGOGASTRODUODENOSCOPY (EGD) WITH PROPOFOL N/A 04/01/2021   Procedure: ESOPHAGOGASTRODUODENOSCOPY (EGD) WITH PROPOFOL;  Surgeon: Dolores Frame, MD;  [facility]: [facility];  Service: Gastroenterology;  Laterality: N/A;  245   LAPAROSCOPIC ASSISTED VAGINAL HYSTERECTOMY  12/13/2011   Procedure: LAPAROSCOPIC ASSISTED VAGINAL HYSTERECTOMY;  Surgeon: Turner Daniels, MD;  Location: [facility];  Service: Gynecology;  Laterality: N/A;   SAVORY DILATION  04/01/2021   Procedure: SAVORY DILATION;  Surgeon: Marguerita Merles, Reuel Boom, MD;  Location: [facility];  Service:  Gastroenterology;;   TONSILLECTOMY     WISDOM TOOTH EXTRACTION     Patient Active Problem List   Diagnosis Date Noted   Vitamin D deficiency 06/02/2022   Breast lump on left side at 6 o'clock position 03/12/2022   Breast pain, left 03/12/2022   Encounter for annual physical exam 11/17/2021   Celiac disease 07/13/2021   IBS (irritable bowel syndrome) 03/12/2021   Constipation 02/03/2021   Degenerative tear of lateral meniscus of right knee 07/06/2019   Snoring 07/21/2015   GERD (gastroesophageal reflux disease) 03/12/2013   Seasonal and perennial allergic rhinitis 03/12/2013   Chronic hoarseness 03/12/2013   Obesity (BMI 30.0-34.9) 06/06/2008    PCP: Kerri Perches, MD   REFERRING PROVIDER: Edwin Cap, DPM  REFERRING DIAG: [id number] (ICD-10-CM) - Peroneal tendinitis, right  THERAPY DIAG:  Pain in right foot  Stiffness of right ankle joint  Rationale for Evaluation and Treatment: Rehabilitation  ONSET DATE: Summer 2024   SUBJECTIVE:   SUBJECTIVE STATEMENT: Pain today is about 7-8/10. No questions regarding HEP>   Pt reports 7/10 pain, right foot. 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 Foot cramps up every once in a while. Pt reports wearing boot for about a month and a half without any progress. Pt also reports having tried injection therapy which was not successful.  PERTINENT HISTORY: TFC -right foot pain fourth and  fifth metatarsal base and peroneal insertion suspect stress fracture of metatarsal, possible bursitis did not respond well to injection therapy Has had corticosteroid injections pt states these did not touch her pain. PAIN:  Are you having pain? Yes: NPRS scale: 7/10 Pain location: sole around base of fifth, dorsum of lateral foot. Pain description: stabbing, cramping pain Aggravating factors: movement, walking, standing for a long period of time Relieving factors: rest, elevation Pain does  increase, just depends on activity level PRECAUTIONS: None   WEIGHT BEARING RESTRICTIONS: No  FALLS:  Has patient fallen in last 6 months? No  OCCUPATION: works with [employer], Pre K teacher   PLOF: Independent  PATIENT GOALS: be able to move around with out being in pain, be able to stand for extended periods  NEXT MD VISIT: towards the end of month (March)  OBJECTIVE:  Note: Objective measures were completed at Evaluation unless otherwise noted.  DIAGNOSTIC FINDINGS: MRI of R foot CLINICAL DATA:  Chronic foot pain and numbness since last summer. Pain mostly around the base of the fourth and fifth metatarsals. No injury or prior surgery.   EXAM: MRI OF THE RIGHT FOREFOOT WITHOUT CONTRAST   TECHNIQUE: Multiplanar, multisequence MR imaging of the right foot was performed. No intravenous contrast was administered.   COMPARISON:  Right foot x-rays dated December 07, 2022.   FINDINGS: Bones/Joint/Cartilage   No marrow signal abnormality. No fracture or dislocation. Joint spaces are preserved. No joint effusion.   Ligaments   Collateral ligaments are intact.  Lisfranc ligament is intact.   Muscles and Tendons Flexor and extensor tendons are intact. No tenosynovitis. No muscle edema or atrophy.   Soft tissue No fluid collection or hematoma.  No soft tissue mass.   IMPRESSION: 1. Normal MRI of the right foot.     Electronically Signed   By: Obie Dredge M.D.   On: 02/12/2023 20:08  PATIENT SURVEYS:  LEFS 39/80  COGNITION: Overall cognitive status: Within functional limits for tasks assessed     SENSATION: Pt states she is able to feel it fine but will go numb occasionally if she sits in one position too long.    PALPATION: Tender to palpation   LOWER EXTREMITY ROM:  Active ROM Right eval Left eval  Hip flexion    Hip extension    Hip abduction    Hip adduction    Hip internal rotation    Hip external rotation    Knee flexion    Knee extension     Ankle dorsiflexion 6, pain 15  Ankle plantarflexion 45, pain 62  Ankle inversion 18, pain 20  Ankle eversion 20, pain 20   (Blank rows = not tested)  LOWER EXTREMITY MMT:  MMT Right eval Left eval  Hip flexion    Hip extension    Hip abduction    Hip adduction    Hip internal rotation    Hip external rotation    Knee flexion    Knee extension    Ankle dorsiflexion 3+, pain 5  Ankle plantarflexion 4-, pain same every rep 5  Ankle inversion 3, pain 5  Ankle eversion 3-, pain 5   (Blank rows = not tested)   FUNCTIONAL TESTS:  2 minute walk test: 378 feet SLS: L: 30s     R:  8s, 21s muscle fatigue GAIT: Distance walked: 378 feet Assistive device utilized: None Level of assistance: Complete Independence Comments: Pt demonstrates antalgic gait with decreased speed, and stride length and favoring of RLE.  TREATMENT DATE:  05/09/2023  -BAPS L3 DF/PF x 3' CW/ 1' -Alternating Great toe extension/4 digit flexion and then extension 3 x 1' -Toe spreading 3 x 1' -Eccentric ankle eversion x 20 with 5'' isometric -Slanted heel raises x 20 with R weight shift -Slanted toe raises x 20 -Tandem stance 1 x 1'   05/04/23: Therapeutic Exercises Seated position - BAPS L3 DF/PF; Inv/Ev 10x; Cw/CCW 5x each - Plantarflexion 2x 10 3" holds with GTB  Manual: - Decongestive massage for edema control with LE elevated 12 minutes - Kinesiotape for edema control on dorsolateral aspect of foot - Ice massage x 7 minutes    04/29/23: Therapeutic Exercises Seated position -Towel crunch 1 time -10 Marble pick up -Isometric contraction PF, DF, In, Env 10x 5" each -Inversion/Eversion with towel slides  Manual therapy: STM to superior lateral foot, sole of foot to medial arch PROM all directions  Self care: Educated benefits with compression garments for edema  control Measurements taken and given handout for ETI     PATIENT EDUCATION:  Education details: Pt was educated on findings of PT evaluation, prognosis, frequency of therapy visits and rationale, attendance policy, and HEP if given.   Person educated: Patient Education method: Explanation, Demonstration, Tactile cues, and Handouts Education comprehension: verbalized understanding  HOME EXERCISE PROGRAM: Access Code: [email or web address] URL: [email or web address] Date: 04/05/2023 Prepared by: Luz Lex  Exercises - Standing Heel Raise  - 1 x daily - 7 x weekly - 3 sets - 10 reps - Ankle Alphabet in Elevation  - 1 x daily - 7 x weekly - 3 sets - 10 reps Note: Unable to give pt handout following initial evaluation but was able to access HEP via medbridge app.  04/29/23: Access Code: [email or web address] URL: [email or web address] Date: 04/29/2023 Prepared by: Becky Sax  Exercises - Standing Heel Raise  - 1 x daily - 7 x weekly - 3 sets - 10 reps - Ankle Alphabet in Elevation  - 1 x daily - 7 x weekly - 3 sets - 10 reps - Isometric Ankle Dorsiflexion and Plantarflexion  - 1 x daily - 7 x weekly - 3 sets - 10 reps - 5" hold - Isometric Ankle Inversion  - 1 x daily - 7 x weekly - 3 sets - 10 reps - 5" hold - Towel Scrunches  - 1 x daily - 7 x weekly - 3 sets - 10 reps  Access Code: [facility] URL: [email or web address] Date: 05/09/2023 Prepared by: Starling Manns  Exercises - Toe Spreading  - 1 x daily - 7 x weekly - 3 sets - 10 reps - Seated Great Toe Extension  - 1 x daily - 7 x weekly - 3 sets - 10 reps - Seated Lesser Toes Extension  - 1 x daily - 7 x weekly - 3 sets - 10 reps - Toe Raise With Back Against Wall  - 1 x daily - 7 x weekly - 3 sets - 10 reps - Tandem Walking with Counter Support  - 1 x daily - 7 x weekly - 3 sets - 10 reps  ASSESSMENT:  CLINICAL IMPRESSION: Pt tolerated therapy session well with focus on pain control and easy  mobility and strengthening intervention. Pt continues with pain but tolerates all challenges well. Included new balance activity and toe yoga for global blood flow. Pt will benefit from skilled Physical Therapy services to address deficits/limitations in order to improve functional and QOL.     Eval: Patient is a 43 y.o. female who  was seen today for physical therapy evaluation and treatment for M76.71 (ICD-10-CM) - Peroneal tendinitis, right. Pt demonstrates decreased gait speed and pattern (decreased speed and stride lengths). Pt is motivated and is willing to comply with all suggested therapy to the best of her ability. Pt demonstrates decreased ROM and strength on RLE compared to the LLE due to pain during assessment. Patient would benefit from skilled physical therapy to decrease pain of RLE, increase R ankle ROM and strength to improve quality of life, community ambulation and progression towards therapy goals.   OBJECTIVE IMPAIRMENTS: Abnormal gait, decreased activity tolerance, decreased balance, decreased endurance, decreased mobility, difficulty walking, decreased ROM, decreased strength, and pain.   ACTIVITY LIMITATIONS: carrying, lifting, bending, sitting, standing, squatting, stairs, transfers, and dressing  PARTICIPATION LIMITATIONS: meal prep, cleaning, community activity, and occupation  PERSONAL FACTORS: Profession are also affecting patient's functional outcome.   REHAB POTENTIAL: Good  CLINICAL DECISION MAKING: Stable/uncomplicated  EVALUATION COMPLEXITY: Low   GOALS: Goals reviewed with patient? No  SHORT TERM GOALS: Target date: 04/26/2023  Patient will demonstrate evidence of independence with individualized HEP and will report compliance for at least 3 days per week for optimized progression towards remaining therapy goals. Baseline:  Goal status: INITIAL  2.  Patient will report a decrease in pain level during community ambulation by at least 2 points for  improved quality of life. Baseline: 7/10 Goal status: INITIAL     LONG TERM GOALS: Target date: 05/17/2023  Pt will demonstrate a an increase of at least 9 points on the LEFS for improved performance of community ambulation and ADL. Baseline: 39/80 Goal status: INITIAL  2.  Pt will improve 2 MWT by 100 feet in order to demonstrate improved functional ambulatory capacity in community setting.  Baseline: 378 feet (age norm=600 feet) Goal status: INITIAL  3.  Pt will demonstrate at least 4-/5 MMT for right ankle for increased strength during ADL and community ambulation. Baseline: See above Goal status: INITIAL  4.  Pt will report a pain level of at most 3/10 with ADLs and community ambulation for improved quality of life. Baseline: 7/10 Goal status: INITIAL  PLAN:  PT FREQUENCY: 2x/week  PT DURATION: 6 weeks  PLANNED INTERVENTIONS: 97110-Therapeutic exercises, 97530- Therapeutic activity, 97112- Neuromuscular re-education, 97535- Self Care, 78469- Manual therapy, [phone]- Gait training, Patient/Family education, Balance training, and Stair training  PLAN FOR NEXT SESSION: Continue manual therapy and mobilizations if indicated, Progress HEP where necessary, continue RLE strength, balance, and initiate foot intrinsic musculature training.  Nelida Meuse PT, DPT [facility] [phone] office  Nelida Meuse, PT 05/09/2023, 4:00 PM

## 2023-05-10 ENCOUNTER — Ambulatory Visit (HOSPITAL_COMMUNITY)

## 2023-05-10 ENCOUNTER — Encounter (HOSPITAL_COMMUNITY): Payer: Self-pay

## 2023-05-10 DIAGNOSIS — M25671 Stiffness of right ankle, not elsewhere classified: Secondary | ICD-10-CM

## 2023-05-10 DIAGNOSIS — M79671 Pain in right foot: Secondary | ICD-10-CM

## 2023-05-10 NOTE — Therapy (Signed)
 OUTPATIENT PHYSICAL THERAPY LOWER EXTREMITY TREATMENT   Patient Name: [name] MRN: [id number] DOB:[birth year], 43 y.o., female Today's Date: 05/10/2023  END OF SESSION:  PT End of Session - 05/10/23 1601     Visit Number 6    Number of Visits 13    Date for PT Re-Evaluation 05/17/23    Authorization Type BLUE [employer]    Authorization Time Period no auth required, visit limit 60 PT/OT combined hard max    Authorization - Visit Number 6    Authorization - Number of Visits 12    Progress Note Due on Visit 7    PT Start Time 1521    PT Stop Time 1600    PT Time Calculation (min) 39 min    Activity Tolerance Patient tolerated treatment well;No increased pain    Behavior During Therapy [facility] for tasks assessed/performed                Past Medical History:  Diagnosis Date   Anxiety    Hoarseness, chronic 03/12/2013   No pertinent past medical history    Past Surgical History:  Procedure Laterality Date   ABDOMINAL HYSTERECTOMY  2013   BIOPSY  04/01/2021   Procedure: BIOPSY;  Surgeon: Dolores Frame, MD;  Location: [facility];  Service: Gastroenterology;;   CESAREAN SECTION  2012   twins   ESOPHAGOGASTRODUODENOSCOPY N/A 05/02/2013   Procedure: ESOPHAGOGASTRODUODENOSCOPY (EGD);  Surgeon: Malissa Hippo, MD;  Location: [facility];  Service: Endoscopy;  Laterality: N/A;  230   ESOPHAGOGASTRODUODENOSCOPY (EGD) WITH PROPOFOL N/A 04/01/2021   Procedure: ESOPHAGOGASTRODUODENOSCOPY (EGD) WITH PROPOFOL;  Surgeon: Dolores Frame, MD;  Location: [facility];  Service: Gastroenterology;  Laterality: N/A;  245   LAPAROSCOPIC ASSISTED VAGINAL HYSTERECTOMY  12/13/2011   Procedure: LAPAROSCOPIC ASSISTED VAGINAL HYSTERECTOMY;  Surgeon: Turner Daniels, MD;  Location: [facility];  Service: Gynecology;  Laterality: N/A;   SAVORY DILATION  04/01/2021   Procedure: SAVORY DILATION;  Surgeon: Marguerita Merles, Reuel Boom, MD;  Location: [facility];  Service:  Gastroenterology;;   TONSILLECTOMY     WISDOM TOOTH EXTRACTION     Patient Active Problem List   Diagnosis Date Noted   Vitamin D deficiency 06/02/2022   Breast lump on left side at 6 o'clock position 03/12/2022   Breast pain, left 03/12/2022   Encounter for annual physical exam 11/17/2021   Celiac disease 07/13/2021   IBS (irritable bowel syndrome) 03/12/2021   Constipation 02/03/2021   Degenerative tear of lateral meniscus of right knee 07/06/2019   Snoring 07/21/2015   GERD (gastroesophageal reflux disease) 03/12/2013   Seasonal and perennial allergic rhinitis 03/12/2013   Chronic hoarseness 03/12/2013   Obesity (BMI 30.0-34.9) 06/06/2008    PCP: Kerri Perches, MD   REFERRING PROVIDER: Edwin Cap, DPM  REFERRING DIAG: [phone] (ICD-10-CM) - Peroneal tendinitis, right  THERAPY DIAG:  Pain in right foot  Stiffness of right ankle joint  Rationale for Evaluation and Treatment: Rehabilitation  ONSET DATE: Summer 2024   SUBJECTIVE:   SUBJECTIVE STATEMENT: Pain today is about 6/10. No questions regarding HEP. Pt reporting that is going to figure out her next appointment to see foot doctor.   Pt reports 7/10 pain, right foot. 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 Foot cramps up every once in a while. Pt reports wearing boot for about a month and a half without any progress. Pt also reports having  tried injection therapy which was not successful.  PERTINENT HISTORY: TFC -right foot pain fourth and fifth metatarsal base and peroneal insertion suspect stress fracture of metatarsal, possible bursitis did not respond well to injection therapy Has had corticosteroid injections pt states these did not touch her pain. PAIN:  Are you having pain? Yes: NPRS scale: 7/10 Pain location: sole around base of fifth, dorsum of lateral foot. Pain description: stabbing, cramping pain Aggravating factors: movement, walking,  standing for a long period of time Relieving factors: rest, elevation Pain does increase, just depends on activity level PRECAUTIONS: None   WEIGHT BEARING RESTRICTIONS: No  FALLS:  Has patient fallen in last 6 months? No  OCCUPATION: works with [employer], Pre K teacher   PLOF: Independent  PATIENT GOALS: be able to move around with out being in pain, be able to stand for extended periods  NEXT MD VISIT: towards the end of month (March)  OBJECTIVE:  Note: Objective measures were completed at Evaluation unless otherwise noted.  DIAGNOSTIC FINDINGS: MRI of R foot CLINICAL DATA:  Chronic foot pain and numbness since last summer. Pain mostly around the base of the fourth and fifth metatarsals. No injury or prior surgery.   EXAM: MRI OF THE RIGHT FOREFOOT WITHOUT CONTRAST   TECHNIQUE: Multiplanar, multisequence MR imaging of the right foot was performed. No intravenous contrast was administered.   COMPARISON:  Right foot x-rays dated December 07, 2022.   FINDINGS: Bones/Joint/Cartilage   No marrow signal abnormality. No fracture or dislocation. Joint spaces are preserved. No joint effusion.   Ligaments   Collateral ligaments are intact.  Lisfranc ligament is intact.   Muscles and Tendons Flexor and extensor tendons are intact. No tenosynovitis. No muscle edema or atrophy.   Soft tissue No fluid collection or hematoma.  No soft tissue mass.   IMPRESSION: 1. Normal MRI of the right foot.     Electronically Signed   By: Obie Dredge M.D.   On: 02/12/2023 20:08  PATIENT SURVEYS:  LEFS 39/80  COGNITION: Overall cognitive status: Within functional limits for tasks assessed     SENSATION: Pt states she is able to feel it fine but will go numb occasionally if she sits in one position too long.    PALPATION: Tender to palpation   LOWER EXTREMITY ROM:  Active ROM Right eval Left eval  Hip flexion    Hip extension    Hip abduction    Hip adduction     Hip internal rotation    Hip external rotation    Knee flexion    Knee extension    Ankle dorsiflexion 6, pain 15  Ankle plantarflexion 45, pain 62  Ankle inversion 18, pain 20  Ankle eversion 20, pain 20   (Blank rows = not tested)  LOWER EXTREMITY MMT:  MMT Right eval Left eval  Hip flexion    Hip extension    Hip abduction    Hip adduction    Hip internal rotation    Hip external rotation    Knee flexion    Knee extension    Ankle dorsiflexion 3+, pain 5  Ankle plantarflexion 4-, pain same every rep 5  Ankle inversion 3, pain 5  Ankle eversion 3-, pain 5   (Blank rows = not tested)   FUNCTIONAL TESTS:  2 minute walk test: 378 feet SLS: L: 30s     R:  8s, 21s muscle fatigue GAIT: Distance walked: 378 feet Assistive device utilized: None Level of assistance: Complete  Independence Comments: Pt demonstrates antalgic gait with decreased speed, and stride length and favoring of RLE.                                                                                                                                TREATMENT DATE:  05/10/2023  -Eccentric and isometric ankle eversion with blue TB x 20  -Alternating Great toe extension/4 digit flexion and then extension 2 x 1' -Toe spreading 2 x 1' -Tandem stance 2 x 1' -Slanted toe raises x 20 -Decongestive massage for edema and pain with LE elevated x 10' -Ice with LE elevated  05/09/2023  -BAPS L3 DF/PF x 3' CW/ 1' -Alternating Great toe extension/4 digit flexion and then extension 3 x 1' -Toe spreading 3 x 1' -Eccentric ankle eversion x 20 with 5'' isometric -Slanted heel raises x 20 with R weight shift -Slanted toe raises x 20 -Tandem stance 1 x 1'    05/04/23: Therapeutic Exercises Seated position - BAPS L3 DF/PF; Inv/Ev 10x; Cw/CCW 5x each - Plantarflexion 2x 10 3" holds with GTB  Manual: - Decongestive massage for edema control with LE elevated 12 minutes - Kinesiotape for edema control on dorsolateral  aspect of foot - Ice massage x 7 minutes    04/29/23: Therapeutic Exercises Seated position -Towel crunch 1 time -10 Marble pick up -Isometric contraction PF, DF, In, Env 10x 5" each -Inversion/Eversion with towel slides  Manual therapy: STM to superior lateral foot, sole of foot to medial arch PROM all directions  Self care: Educated benefits with compression garments for edema control Measurements taken and given handout for ETI     PATIENT EDUCATION:  Education details: Pt was educated on findings of PT evaluation, prognosis, frequency of therapy visits and rationale, attendance policy, and HEP if given.   Person educated: Patient Education method: Explanation, Demonstration, Tactile cues, and Handouts Education comprehension: verbalized understanding  HOME EXERCISE PROGRAM: Access Code: [email or web address] URL: [email or web address] Date: 04/05/2023 Prepared by: Luz Lex  Exercises - Standing Heel Raise  - 1 x daily - 7 x weekly - 3 sets - 10 reps - Ankle Alphabet in Elevation  - 1 x daily - 7 x weekly - 3 sets - 10 reps Note: Unable to give pt handout following initial evaluation but was able to access HEP via medbridge app.  04/29/23: Access Code: [email or web address] URL: [email or web address] Date: 04/29/2023 Prepared by: Becky Sax  Exercises - Standing Heel Raise  - 1 x daily - 7 x weekly - 3 sets - 10 reps - Ankle Alphabet in Elevation  - 1 x daily - 7 x weekly - 3 sets - 10 reps - Isometric Ankle Dorsiflexion and Plantarflexion  - 1 x daily - 7 x weekly - 3 sets - 10 reps - 5" hold - Isometric Ankle Inversion  - 1 x daily - 7 x weekly - 3 sets - 10 reps - 5" hold - Towel Scrunches  -  1 x daily - 7 x weekly - 3 sets - 10 reps  Access Code: [facility] URL: [email or web address] Date: 05/09/2023 Prepared by: Starling Manns  Exercises - Toe Spreading  - 1 x daily - 7 x weekly - 3 sets - 10 reps - Seated Great Toe Extension  -  1 x daily - 7 x weekly - 3 sets - 10 reps - Seated Lesser Toes Extension  - 1 x daily - 7 x weekly - 3 sets - 10 reps - Toe Raise With Back Against Wall  - 1 x daily - 7 x weekly - 3 sets - 10 reps - Tandem Walking with Counter Support  - 1 x daily - 7 x weekly - 3 sets - 10 reps  ASSESSMENT:  CLINICAL IMPRESSION: Pt tolerating therapy session with good return for mobility and reduced pain level from yesterday. Further tested pt's tolerance to prolonged tandem stance and no reports of pain. Included more manual techniques for today's session. Pt will benefit from skilled Physical Therapy services to address deficits/limitations in order to improve functional and QOL.     Eval: Patient is a 43 y.o. female who was seen today for physical therapy evaluation and treatment for M76.71 (ICD-10-CM) - Peroneal tendinitis, right. Pt demonstrates decreased gait speed and pattern (decreased speed and stride lengths). Pt is motivated and is willing to comply with all suggested therapy to the best of her ability. Pt demonstrates decreased ROM and strength on RLE compared to the LLE due to pain during assessment. Patient would benefit from skilled physical therapy to decrease pain of RLE, increase R ankle ROM and strength to improve quality of life, community ambulation and progression towards therapy goals.   OBJECTIVE IMPAIRMENTS: Abnormal gait, decreased activity tolerance, decreased balance, decreased endurance, decreased mobility, difficulty walking, decreased ROM, decreased strength, and pain.   ACTIVITY LIMITATIONS: carrying, lifting, bending, sitting, standing, squatting, stairs, transfers, and dressing  PARTICIPATION LIMITATIONS: meal prep, cleaning, community activity, and occupation  PERSONAL FACTORS: Profession are also affecting patient's functional outcome.   REHAB POTENTIAL: Good  CLINICAL DECISION MAKING: Stable/uncomplicated  EVALUATION COMPLEXITY: Low   GOALS: Goals reviewed with  patient? No  SHORT TERM GOALS: Target date: 04/26/2023  Patient will demonstrate evidence of independence with individualized HEP and will report compliance for at least 3 days per week for optimized progression towards remaining therapy goals. Baseline:  Goal status: INITIAL  2.  Patient will report a decrease in pain level during community ambulation by at least 2 points for improved quality of life. Baseline: 7/10 Goal status: INITIAL     LONG TERM GOALS: Target date: 05/17/2023  Pt will demonstrate a an increase of at least 9 points on the LEFS for improved performance of community ambulation and ADL. Baseline: 39/80 Goal status: INITIAL  2.  Pt will improve 2 MWT by 100 feet in order to demonstrate improved functional ambulatory capacity in community setting.  Baseline: 378 feet (age norm=600 feet) Goal status: INITIAL  3.  Pt will demonstrate at least 4-/5 MMT for right ankle for increased strength during ADL and community ambulation. Baseline: See above Goal status: INITIAL  4.  Pt will report a pain level of at most 3/10 with ADLs and community ambulation for improved quality of life. Baseline: 7/10 Goal status: INITIAL  PLAN:  PT FREQUENCY: 2x/week  PT DURATION: 6 weeks  PLANNED INTERVENTIONS: 97110-Therapeutic exercises, 97530- Therapeutic activity, [id number]- Neuromuscular re-education, 97535- Self Care, 11914- Manual therapy, [phone]- Gait training, Patient/Family education, Balance  training, and Stair training  PLAN FOR NEXT SESSION: Continue manual therapy and mobilizations if indicated, Progress HEP where necessary, continue RLE strength, balance, and initiate foot intrinsic musculature training.  Nelida Meuse PT, DPT [facility] [address] [phone] office  Nelida Meuse, PT 05/10/2023, 4:02 PM

## 2023-05-17 ENCOUNTER — Ambulatory Visit (HOSPITAL_COMMUNITY): Admitting: Physical Therapy

## 2023-05-17 DIAGNOSIS — M25671 Stiffness of right ankle, not elsewhere classified: Secondary | ICD-10-CM

## 2023-05-17 DIAGNOSIS — M79671 Pain in right foot: Secondary | ICD-10-CM | POA: Diagnosis not present

## 2023-05-17 NOTE — Therapy (Signed)
 OUTPATIENT PHYSICAL THERAPY LOWER EXTREMITY TREATMENT Progress Note Reporting Period 04/05/23 to 05/17/23  See note below for Objective Data and Assessment of Progress/Goals.      Patient Name: [name] MRN: [id number] DOB:May 31, 1980, 43 y.o., female Today's Date: 05/17/2023  END OF SESSION:  PT End of Session - 05/17/23 0739     Visit Number 7    Number of Visits 13    Date for PT Re-Evaluation 05/17/23    Authorization Type BLUE [employer]    Authorization Time Period no auth required, visit limit 60 PT/OT combined hard max    Authorization - Visit Number 7    Authorization - Number of Visits 12    PT Start Time 0722    PT Stop Time 0800    PT Time Calculation (min) 38 min    Activity Tolerance Patient tolerated treatment well;No increased pain    Behavior During Therapy [facility] for tasks assessed/performed                 Past Medical History:  Diagnosis Date   Anxiety    Hoarseness, chronic 03/12/2013   No pertinent past medical history    Past Surgical History:  Procedure Laterality Date   ABDOMINAL HYSTERECTOMY  2013   BIOPSY  04/01/2021   Procedure: BIOPSY;  Surgeon: Urban Garden, MD;  Location: [facility];  Service: Gastroenterology;;   CESAREAN SECTION  2012   twins   ESOPHAGOGASTRODUODENOSCOPY N/A 05/02/2013   Procedure: ESOPHAGOGASTRODUODENOSCOPY (EGD);  Surgeon: Ruby Corporal, MD;  Location: [facility];  Service: Endoscopy;  Laterality: N/A;  230   ESOPHAGOGASTRODUODENOSCOPY (EGD) WITH PROPOFOL N/A 04/01/2021   Procedure: ESOPHAGOGASTRODUODENOSCOPY (EGD) WITH PROPOFOL;  Surgeon: Urban Garden, MD;  Location: [facility];  Service: Gastroenterology;  Laterality: N/A;  245   LAPAROSCOPIC ASSISTED VAGINAL HYSTERECTOMY  12/13/2011   Procedure: LAPAROSCOPIC ASSISTED VAGINAL HYSTERECTOMY;  Surgeon: Denette Finner, MD;  Location: [facility];  Service: Gynecology;  Laterality: N/A;   SAVORY DILATION  04/01/2021   Procedure: SAVORY  DILATION;  Surgeon: Umberto Ganong, Bearl Limes, MD;  Location: [facility];  Service: Gastroenterology;;   TONSILLECTOMY     WISDOM TOOTH EXTRACTION     Patient Active Problem List   Diagnosis Date Noted   Vitamin D deficiency 06/02/2022   Breast lump on left side at 6 o'clock position 03/12/2022   Breast pain, left 03/12/2022   Encounter for annual physical exam 11/17/2021   Celiac disease 07/13/2021   IBS (irritable bowel syndrome) 03/12/2021   Constipation 02/03/2021   Degenerative tear of lateral meniscus of right knee 07/06/2019   Snoring 07/21/2015   GERD (gastroesophageal reflux disease) 03/12/2013   Seasonal and perennial allergic rhinitis 03/12/2013   Chronic hoarseness 03/12/2013   Obesity (BMI 30.0-34.9) 06/06/2008    PCP: Towanda Fret, MD   REFERRING PROVIDER: Floyce Hutching, DPM  REFERRING DIAG: [id number] (ICD-10-CM) - Peroneal tendinitis, right  THERAPY DIAG:  Pain in right foot  Stiffness of right ankle joint 666 Rationale for Evaluation and Treatment: Rehabilitation  ONSET DATE: Summer 2024   SUBJECTIVE:   SUBJECTIVE STATEMENT: Pt states she's off this week for spring break but went to a conference over the weekend that required some walking.  Currently with increased pain at 9/10.  Reports she did not call MD last week but Intends to call today and get an appt due to increased pain. No improvement reported subjectively since beginning therapy.   Pt reports  7/10 pain, right foot. 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 Foot cramps up every once in a while. Pt reports wearing boot for about a month and a half without any progress. Pt also reports having tried injection therapy which was not successful.  PERTINENT HISTORY: TFC -right foot pain fourth and fifth metatarsal base and peroneal insertion suspect stress fracture of metatarsal, possible bursitis did not respond well to injection therapy Has  had corticosteroid injections pt states these did not touch her pain. PAIN:  Are you having pain? Yes: NPRS scale: 7/10 Pain location: sole around base of fifth, dorsum of lateral foot. Pain description: stabbing, cramping pain Aggravating factors: movement, walking, standing for a long period of time Relieving factors: rest, elevation Pain does increase, just depends on activity level PRECAUTIONS: None   WEIGHT BEARING RESTRICTIONS: No  FALLS:  Has patient fallen in last 6 months? No  OCCUPATION: works with [employer], Pre K teacher   PLOF: Independent  PATIENT GOALS: be able to move around with out being in pain, be able to stand for extended periods  NEXT MD VISIT: towards the end of month (March)  OBJECTIVE:  Note: Objective measures were completed at Evaluation unless otherwise noted.  DIAGNOSTIC FINDINGS: MRI of R foot CLINICAL DATA:  Chronic foot pain and numbness since last summer. Pain mostly around the base of the fourth and fifth metatarsals. No injury or prior surgery.   EXAM: MRI OF THE RIGHT FOREFOOT WITHOUT CONTRAST   TECHNIQUE: Multiplanar, multisequence MR imaging of the right foot was performed. No intravenous contrast was administered.   COMPARISON:  Right foot x-rays dated December 07, 2022.   FINDINGS: Bones/Joint/Cartilage   No marrow signal abnormality. No fracture or dislocation. Joint spaces are preserved. No joint effusion.   Ligaments   Collateral ligaments are intact.  Lisfranc ligament is intact.   Muscles and Tendons Flexor and extensor tendons are intact. No tenosynovitis. No muscle edema or atrophy.   Soft tissue No fluid collection or hematoma.  No soft tissue mass.   IMPRESSION: 1. Normal MRI of the right foot.     Electronically Signed   By: Obie Dredge M.D.   On: 02/12/2023 20:08  PATIENT SURVEYS:  LEFS 05/17/23: 16/80 (was 39/80 at evaluation)  COGNITION: Overall cognitive status: Within functional limits for tasks  assessed     SENSATION: Pt states she is able to feel it fine but will go numb occasionally if she sits in one position too long.    PALPATION: Tender to palpation   LOWER EXTREMITY ROM:  Active ROM Right eval Right; 05/17/23 Left eval  Hip flexion     Hip extension     Hip abduction     Hip adduction     Hip internal rotation     Hip external rotation     Knee flexion     Knee extension     Ankle dorsiflexion 6, pain 5, pain 15  Ankle plantarflexion 45, pain 45, pain 62  Ankle inversion 18, pain 21, pain 20  Ankle eversion 20, pain 24, pain 20   (Blank rows = not tested)  LOWER EXTREMITY MMT:  MMT Right eval Right 05/17/23 Left eval  Hip flexion     Hip extension     Hip abduction     Hip adduction     Hip internal rotation     Hip external rotation     Knee flexion  Knee extension     Ankle dorsiflexion 3+, pain 3+ with pain 5  Ankle plantarflexion 4-, pain same every rep 4- with pain 5  Ankle inversion 3, pain 3, pain 5  Ankle eversion 3-, pain 3-, pain 5   (Blank rows = not tested)   FUNCTIONAL TESTS:  Evaluation: 2 minute walk test: 378 feet SLS: L: 30s     R:  8s, 21s muscle fatigue  05/17/23 2 minute walk test: 428 feet  (was 378 feet) SLS: L: NT (was 30s)     R:  20" (was 8s, 21s muscle fatigue)  GAIT: Distance walked: 378 feet Assistive device utilized: None Level of assistance: Complete Independence Comments: Pt demonstrates antalgic gait with decreased speed, and stride length and favoring of RLE.                                                                                                                                TREATMENT DATE:  05/17/23 Standing: heelrasies 10X  Toeraises 10X  Slant board stretch 3X30" Progress note:  2 minute walk test: 428 feet  (was 378 feet) SLS: L: NT (was 30s)     R:  20" (was 8s, 21s muscle fatigue) LEFS 05/17/23: 16/80 (was 39/80 at evaluation) see below Goal review Lower Extremity Functional  Scale Summary 1. Any of usual work, housework or school activities.Moderate difficulty with usual work, housework or school activities(2 points) 2. Usual hobbies, recreational or sporting activities.Quite a bit of difficulty with usual hobbies, recreational or sporting activities(1 points) 3. Getting into or out of the bath.Moderate difficulty getting into or out of the bat (2 points) 4. Walking between rooms.Quite a bit of difficulty walking between rooms (1 points) 5. Putting on your shoes or socks.Quite a bit of difficulty putting on shoes or socks(1 points) 6. Squatting.Quite a bit of difficulty squatting(1 points) 7. Lifting an object from the floor.Quite a bit of difficulty lifting an object from the floor(1 points) 8. Performing light activities around home.Moderate difficulty performing light activities around home(2 points) 9. Performing heavy activities around home.Quite a bit of difficulty performing heavy activities around home(1 points) 10. Getting into or out of a car.Moderate difficulty getting into or out of a car(2 points) 11. Walking 2 blocks.A lot of difficulty walking 2 blocks(0 points) 12. Walking a mile.A lot of difficulty walking a mile(0 points) 13. Going up or down 10 stairs.A lot of difficulty going up or down 10 stairs(0 points) 14. Standing for 1 hour.A lot of difficulty standing for 1 hour(0 points) 15. Sitting for 1 hour.A lot of difficulty sitting for 1 hour(0 points) 16. Running on even ground.A lot of difficulty running on even ground(0 points) 17. Running on uneven ground.A lot of difficulty running on uneven ground(0 points) 18. Making sharp turns while running fast.A lot of difficulty making sharp turns while running fast(0 points) 19. Hopping.A lot of difficulty hopping (0 points) 20. Rolling over in bed.Moderate  difficulty rolling over in bed (2 points) Lower Extremity Functional Score:  16/80=20.0 percent.  05/10/2023  -Eccentric and isometric ankle  eversion with blue TB x 20  -Alternating Great toe extension/4 digit flexion and then extension 2 x 1' -Toe spreading 2 x 1' -Tandem stance 2 x 1' -Slanted toe raises x 20 -Decongestive massage for edema and pain with LE elevated x 10' -Ice with LE elevated  05/09/2023  -BAPS L3 DF/PF x 3' CW/ 1' -Alternating Great toe extension/4 digit flexion and then extension 3 x 1' -Toe spreading 3 x 1' -Eccentric ankle eversion x 20 with 5'' isometric -Slanted heel raises x 20 with R weight shift -Slanted toe raises x 20 -Tandem stance 1 x 1'    05/04/23: Therapeutic Exercises Seated position - BAPS L3 DF/PF; Inv/Ev 10x; Cw/CCW 5x each - Plantarflexion 2x 10 3" holds with GTB  Manual: - Decongestive massage for edema control with LE elevated 12 minutes - Kinesiotape for edema control on dorsolateral aspect of foot - Ice massage x 7 minutes    04/29/23: Therapeutic Exercises Seated position -Towel crunch 1 time -10 Marble pick up -Isometric contraction PF, DF, In, Env 10x 5" each -Inversion/Eversion with towel slides  Manual therapy: STM to superior lateral foot, sole of foot to medial arch PROM all directions  Self care: Educated benefits with compression garments for edema control Measurements taken and given handout for ETI     PATIENT EDUCATION:  Education details: Pt was educated on findings of PT evaluation, prognosis, frequency of therapy visits and rationale, attendance policy, and HEP if given.   Person educated: Patient Education method: Explanation, Demonstration, Tactile cues, and Handouts Education comprehension: verbalized understanding  HOME EXERCISE PROGRAM: Access Code: [email or web address] URL: [email or web address] Date: 04/05/2023 Prepared by: Armond Bertin  Exercises - Standing Heel Raise  - 1 x daily - 7 x weekly - 3 sets - 10 reps - Ankle Alphabet in Elevation  - 1 x daily - 7 x weekly - 3 sets - 10 reps Note: Unable to give pt handout following  initial evaluation but was able to access HEP via medbridge app.  04/29/23: Access Code: [email or web address] URL: [email or web address] Date: 04/29/2023 Prepared by: Minor Amble  Exercises - Standing Heel Raise  - 1 x daily - 7 x weekly - 3 sets - 10 reps - Ankle Alphabet in Elevation  - 1 x daily - 7 x weekly - 3 sets - 10 reps - Isometric Ankle Dorsiflexion and Plantarflexion  - 1 x daily - 7 x weekly - 3 sets - 10 reps - 5" hold - Isometric Ankle Inversion  - 1 x daily - 7 x weekly - 3 sets - 10 reps - 5" hold - Towel Scrunches  - 1 x daily - 7 x weekly - 3 sets - 10 reps  Access Code: [facility] URL: [email or web address] Date: 05/09/2023 Prepared by: Irene Mannheim  Exercises - Toe Spreading  - 1 x daily - 7 x weekly - 3 sets - 10 reps - Seated Great Toe Extension  - 1 x daily - 7 x weekly - 3 sets - 10 reps - Seated Lesser Toes Extension  - 1 x daily - 7 x weekly - 3 sets - 10 reps - Toe Raise With Back Against Wall  - 1 x daily - 7 x weekly - 3 sets - 10 reps - Tandem Walking with Counter Support  - 1 x daily - 7 x weekly - 3 sets - 10 reps  ASSESSMENT:  CLINICAL IMPRESSION: Progress note completed this session.  Subjectively and objectively pt has made little progress these past 4 weeks as she is limited by pain.   Began with standing exercises which were tolerated well.  Functional testing and lower extremity functional scale was completed.  Pt did make improvements with single leg stance and 2 minute walk test, however no change in LE strength and ROM today.  LEFS was significantly lower than evaluation indicating more functional limitation due to pain. Despite high pain reports, pt remained jovial throughout session without any pain behaviors noted.  Pt wishes to return to MD before proceeding with therapy but will keep appts for next week and follow up with therapist regarding continuation or finishing for now.       Eval: Patient is a 43 y.o. female who  was seen today for physical therapy evaluation and treatment for M76.71 (ICD-10-CM) - Peroneal tendinitis, right. Pt demonstrates decreased gait speed and pattern (decreased speed and stride lengths). Pt is motivated and is willing to comply with all suggested therapy to the best of her ability. Pt demonstrates decreased ROM and strength on RLE compared to the LLE due to pain during assessment. Patient would benefit from skilled physical therapy to decrease pain of RLE, increase R ankle ROM and strength to improve quality of life, community ambulation and progression towards therapy goals.   OBJECTIVE IMPAIRMENTS: Abnormal gait, decreased activity tolerance, decreased balance, decreased endurance, decreased mobility, difficulty walking, decreased ROM, decreased strength, and pain.   ACTIVITY LIMITATIONS: carrying, lifting, bending, sitting, standing, squatting, stairs, transfers, and dressing  PARTICIPATION LIMITATIONS: meal prep, cleaning, community activity, and occupation  PERSONAL FACTORS: Profession are also affecting patient's functional outcome.   REHAB POTENTIAL: Good  CLINICAL DECISION MAKING: Stable/uncomplicated  EVALUATION COMPLEXITY: Low   GOALS: Goals reviewed with patient? Yes  SHORT TERM GOALS: Target date: 04/26/2023  Patient will demonstrate evidence of independence with individualized HEP and will report compliance for at least 3 days per week for optimized progression towards remaining therapy goals. Baseline:  Goal status: MET  2.  Patient will report a decrease in pain level during community ambulation by at least 2 points for improved quality of life. Baseline: 7/10 Goal status: IN PROGRESS     LONG TERM GOALS: Target date: 05/17/2023  Pt will demonstrate a an increase of at least 9 points on the LEFS for improved performance of community ambulation and ADL. Baseline: 39/80 Goal status: IN PROGRESS  2.  Pt will improve 2 MWT by 100 feet in order to  demonstrate improved functional ambulatory capacity in community setting.  Baseline: 378 feet (age norm=600 feet) Goal status: IN PROGRESS  3.  Pt will demonstrate at least 4-/5 MMT for right ankle for increased strength during ADL and community ambulation. Baseline: See above Goal status: IN PROGRESS  4.  Pt will report a pain level of at most 3/10 with ADLs and community ambulation for improved quality of life. Baseline: 7/10 Goal status: IN PROGRESS  PLAN:  PT FREQUENCY: 2x/week  PT DURATION: 6 weeks  PLANNED INTERVENTIONS: 97110-Therapeutic exercises, 97530- Therapeutic activity, [id number]- Neuromuscular re-education, 97535- Self Care, [id number]- Manual therapy, [phone]- Gait training, Patient/Family education, Balance training, and Stair training  PLAN FOR NEXT SESSION: Continue with therapy pending appt with MD and further orders.  Continue with Rt LE strengthening, balance and intrinsic musculature training for Rt foot.   Emeline Gins B, PTA 05/17/2023, 4:08 PM

## 2023-05-18 ENCOUNTER — Encounter (HOSPITAL_COMMUNITY): Admitting: Physical Therapy

## 2023-05-20 NOTE — Addendum Note (Signed)
 Addended by: Avelynn Sellin on: 05/20/2023 09:35 AM   Modules accepted: Orders

## 2023-05-23 ENCOUNTER — Encounter (HOSPITAL_COMMUNITY)

## 2023-05-26 ENCOUNTER — Ambulatory Visit (HOSPITAL_COMMUNITY)

## 2023-05-26 ENCOUNTER — Encounter (HOSPITAL_COMMUNITY): Payer: Self-pay

## 2023-05-26 DIAGNOSIS — M25671 Stiffness of right ankle, not elsewhere classified: Secondary | ICD-10-CM

## 2023-05-26 DIAGNOSIS — M79671 Pain in right foot: Secondary | ICD-10-CM | POA: Diagnosis not present

## 2023-05-26 NOTE — Therapy (Signed)
 OUTPATIENT PHYSICAL THERAPY LOWER EXTREMITY TREATMENT     Patient Name: [name] MRN: [id number] DOB:1980-12-23, 43 y.o., female Today's Date: 05/26/2023  END OF SESSION:  PT End of Session - 05/26/23 1554     Visit Number 8    Number of Visits 15    Date for PT Re-Evaluation 06/14/23    Authorization Type BLUE CROSS BLUE [employer]IELD    Authorization Time Period no auth required, visit limit 60 PT/OT combined hard max    Authorization - Visit Number 8    Authorization - Number of Visits 60    Progress Note Due on Visit 17    PT Start Time 1520    PT Stop Time 1553    PT Time Calculation (min) 33 min    Activity Tolerance Patient tolerated treatment well;No increased pain    Behavior During Therapy [facility] for tasks assessed/performed              Past Medical History:  Diagnosis Date   Anxiety    Hoarseness, chronic 03/12/2013   No pertinent past medical history    Past Surgical History:  Procedure Laterality Date   ABDOMINAL HYSTERECTOMY  2013   BIOPSY  04/01/2021   Procedure: BIOPSY;  Surgeon: Urban Garden, MD;  Location: [facility];  Service: Gastroenterology;;   CESAREAN SECTION  2012   twins   ESOPHAGOGASTRODUODENOSCOPY N/A 05/02/2013   Procedure: ESOPHAGOGASTRODUODENOSCOPY (EGD);  Surgeon: Ruby Corporal, MD;  Location: [facility];  Service: Endoscopy;  Laterality: N/A;  230   ESOPHAGOGASTRODUODENOSCOPY (EGD) WITH PROPOFOL  N/A 04/01/2021   Procedure: ESOPHAGOGASTRODUODENOSCOPY (EGD) WITH PROPOFOL ;  Surgeon: Urban Garden, MD;  Location: [facility];  Service: Gastroenterology;  Laterality: N/A;  245   LAPAROSCOPIC ASSISTED VAGINAL HYSTERECTOMY  12/13/2011   Procedure: LAPAROSCOPIC ASSISTED VAGINAL HYSTERECTOMY;  Surgeon: Denette Finner, MD;  Location: [facility];  Service: Gynecology;  Laterality: N/A;   SAVORY DILATION  04/01/2021   Procedure: SAVORY DILATION;  Surgeon: Umberto Ganong, Bearl Limes, MD;  Location: [facility];  Service:  Gastroenterology;;   TONSILLECTOMY     WISDOM TOOTH EXTRACTION     Patient Active Problem List   Diagnosis Date Noted   Vitamin D  deficiency 06/02/2022   Breast lump on left side at 6 o'clock position 03/12/2022   Breast pain, left 03/12/2022   Encounter for annual physical exam 11/17/2021   Celiac disease 07/13/2021   IBS (irritable bowel syndrome) 03/12/2021   Constipation 02/03/2021   Degenerative tear of lateral meniscus of right knee 07/06/2019   Snoring 07/21/2015   GERD (gastroesophageal reflux disease) 03/12/2013   Seasonal and perennial allergic rhinitis 03/12/2013   Chronic hoarseness 03/12/2013   Obesity (BMI 30.0-34.9) 06/06/2008    PCP: Towanda Fret, MD   REFERRING PROVIDER: Floyce Hutching, DPM  REFERRING DIAG: [id number] (ICD-10-CM) - Peroneal tendinitis, right  THERAPY DIAG:  Pain in right foot  Stiffness of right ankle joint  Rationale for Evaluation and Treatment: Rehabilitation  ONSET DATE: Summer 2024   SUBJECTIVE:   SUBJECTIVE STATEMENT: Pt states her pain level is 6/10 pain today. She does not think PT is truly helping which is sad about this but wants to stay on until she figures out what her foot doctor recommends.   Pt reports 7/10 pain, right foot. 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 Foot cramps up every once in a while. Pt reports wearing boot for about  a month and a half without any progress. Pt also reports having tried injection therapy which was not successful.  PERTINENT HISTORY: TFC -right foot pain fourth and fifth metatarsal base and peroneal insertion suspect stress fracture of metatarsal, possible bursitis did not respond well to injection therapy Has had corticosteroid injections pt states these did not touch her pain. PAIN:  Are you having pain? Yes: NPRS scale: 7/10 Pain location: sole around base of fifth, dorsum of lateral foot. Pain description: stabbing, cramping  pain Aggravating factors: movement, walking, standing for a long period of time Relieving factors: rest, elevation Pain does increase, just depends on activity level PRECAUTIONS: None   WEIGHT BEARING RESTRICTIONS: No  FALLS:  Has patient fallen in last 6 months? No  OCCUPATION: works with [employer], Pre K teacher   PLOF: Independent  PATIENT GOALS: be able to move around with out being in pain, be able to stand for extended periods  NEXT MD VISIT: towards the end of month (March)  OBJECTIVE:  Note: Objective measures were completed at Evaluation unless otherwise noted.  DIAGNOSTIC FINDINGS: MRI of R foot CLINICAL DATA:  Chronic foot pain and numbness since last summer. Pain mostly around the base of the fourth and fifth metatarsals. No injury or prior surgery.   EXAM: MRI OF THE RIGHT FOREFOOT WITHOUT CONTRAST   TECHNIQUE: Multiplanar, multisequence MR imaging of the right foot was performed. No intravenous contrast was administered.   COMPARISON:  Right foot x-rays dated December 07, 2022.   FINDINGS: Bones/Joint/Cartilage   No marrow signal abnormality. No fracture or dislocation. Joint spaces are preserved. No joint effusion.   Ligaments   Collateral ligaments are intact.  Lisfranc ligament is intact.   Muscles and Tendons Flexor and extensor tendons are intact. No tenosynovitis. No muscle edema or atrophy.   Soft tissue No fluid collection or hematoma.  No soft tissue mass.   IMPRESSION: 1. Normal MRI of the right foot.     Electronically Signed   By: Aleta Anda M.D.   On: 02/12/2023 20:08  PATIENT SURVEYS:  LEFS 05/17/23: 16/80 (was 39/80 at evaluation)  COGNITION: Overall cognitive status: Within functional limits for tasks assessed     SENSATION: Pt states she is able to feel it fine but will go numb occasionally if she sits in one position too long.    PALPATION: Tender to palpation   LOWER EXTREMITY ROM:  Active ROM Right eval  Right; 05/17/23 Left eval  Hip flexion     Hip extension     Hip abduction     Hip adduction     Hip internal rotation     Hip external rotation     Knee flexion     Knee extension     Ankle dorsiflexion 6, pain 5, pain 15  Ankle plantarflexion 45, pain 45, pain 62  Ankle inversion 18, pain 21, pain 20  Ankle eversion 20, pain 24, pain 20   (Blank rows = not tested)  LOWER EXTREMITY MMT:  MMT Right eval Right 05/17/23 Left eval  Hip flexion     Hip extension     Hip abduction     Hip adduction     Hip internal rotation     Hip external rotation     Knee flexion     Knee extension     Ankle dorsiflexion 3+, pain 3+ with pain 5  Ankle plantarflexion 4-, pain same every rep 4- with pain 5  Ankle inversion 3, pain  3, pain 5  Ankle eversion 3-, pain 3-, pain 5   (Blank rows = not tested)   FUNCTIONAL TESTS:  Evaluation: 2 minute walk test: 378 feet SLS: L: 30s     R:  8s, 21s muscle fatigue  05/17/23 2 minute walk test: 428 feet  (was 378 feet) SLS: L: NT (was 30s)     R:  20" (was 8s, 21s muscle fatigue)  GAIT: Distance walked: 378 feet Assistive device utilized: None Level of assistance: Complete Independence Comments: Pt demonstrates antalgic gait with decreased speed, and stride length and favoring of RLE.                                                                                                                                TREATMENT DATE:  05/26/2023  -Manually applied resistance with isometric and eccentric hold x 15' with rest breaks in between -Education on proper activity tolerance to reduce pain levels and various forms of pain modulation at home.   -shortened session due to pain intensity this session.    05/17/23 Standing: heelrasies 10X  Toeraises 10X  Slant board stretch 3X30" Progress note:  2 minute walk test: 428 feet  (was 378 feet) SLS: L: NT (was 30s)     R:  20" (was 8s, 21s muscle fatigue) LEFS 05/17/23: 16/80 (was 39/80 at  evaluation) see below Goal review Lower Extremity Functional Scale Summary 1. Any of usual work, housework or school activities.Moderate difficulty with usual work, housework or school activities(2 points) 2. Usual hobbies, recreational or sporting activities.Quite a bit of difficulty with usual hobbies, recreational or sporting activities(1 points) 3. Getting into or out of the bath.Moderate difficulty getting into or out of the bat (2 points) 4. Walking between rooms.Quite a bit of difficulty walking between rooms (1 points) 5. Putting on your shoes or socks.Quite a bit of difficulty putting on shoes or socks(1 points) 6. Squatting.Quite a bit of difficulty squatting(1 points) 7. Lifting an object from the floor.Quite a bit of difficulty lifting an object from the floor(1 points) 8. Performing light activities around home.Moderate difficulty performing light activities around home(2 points) 9. Performing heavy activities around home.Quite a bit of difficulty performing heavy activities around home(1 points) 10. Getting into or out of a car.Moderate difficulty getting into or out of a car(2 points) 11. Walking 2 blocks.A lot of difficulty walking 2 blocks(0 points) 12. Walking a mile.A lot of difficulty walking a mile(0 points) 13. Going up or down 10 stairs.A lot of difficulty going up or down 10 stairs(0 points) 14. Standing for 1 hour.A lot of difficulty standing for 1 hour(0 points) 15. Sitting for 1 hour.A lot of difficulty sitting for 1 hour(0 points) 16. Running on even ground.A lot of difficulty running on even ground(0 points) 17. Running on uneven ground.A lot of difficulty running on uneven ground(0 points) 18. Making sharp turns while running fast.A lot of difficulty making sharp turns while  running fast(0 points) 19. Hopping.A lot of difficulty hopping (0 points) 20. Rolling over in bed.Moderate difficulty rolling over in bed (2 points) Lower Extremity Functional Score:   16/80=20.0 percent.  05/10/2023  -Eccentric and isometric ankle eversion with blue TB x 20  -Alternating Great toe extension/4 digit flexion and then extension 2 x 1' -Toe spreading 2 x 1' -Tandem stance 2 x 1' -Slanted toe raises x 20 -Decongestive massage for edema and pain with LE elevated x 10' -Ice with LE elevated     PATIENT EDUCATION:  Education details: Pt was educated on findings of PT evaluation, prognosis, frequency of therapy visits and rationale, attendance policy, and HEP if given.   Person educated: Patient Education method: Explanation, Demonstration, Tactile cues, and Handouts Education comprehension: verbalized understanding  HOME EXERCISE PROGRAM: Access Code: [email or web address] URL: [email or web address] Date: 04/05/2023 Prepared by: Armond Bertin  Exercises - Standing Heel Raise  - 1 x daily - 7 x weekly - 3 sets - 10 reps - Ankle Alphabet in Elevation  - 1 x daily - 7 x weekly - 3 sets - 10 reps Note: Unable to give pt handout following initial evaluation but was able to access HEP via medbridge app.  04/29/23: Access Code: [email or web address] URL: [email or web address] Date: 04/29/2023 Prepared by: Minor Amble  Exercises - Standing Heel Raise  - 1 x daily - 7 x weekly - 3 sets - 10 reps - Ankle Alphabet in Elevation  - 1 x daily - 7 x weekly - 3 sets - 10 reps - Isometric Ankle Dorsiflexion and Plantarflexion  - 1 x daily - 7 x weekly - 3 sets - 10 reps - 5" hold - Isometric Ankle Inversion  - 1 x daily - 7 x weekly - 3 sets - 10 reps - 5" hold - Towel Scrunches  - 1 x daily - 7 x weekly - 3 sets - 10 reps  Access Code: [facility] URL: [email or web address] Date: 05/09/2023 Prepared by: Irene Mannheim  Exercises - Toe Spreading  - 1 x daily - 7 x weekly - 3 sets - 10 reps - Seated Great Toe Extension  - 1 x daily - 7 x weekly - 3 sets - 10 reps - Seated Lesser Toes Extension  - 1 x daily - 7 x weekly - 3 sets - 10 reps -  Toe Raise With Back Against Wall  - 1 x daily - 7 x weekly - 3 sets - 10 reps - Tandem Walking with Counter Support  - 1 x daily - 7 x weekly - 3 sets - 10 reps  ASSESSMENT:  CLINICAL IMPRESSION: Limited session today due to reducing chances of increased irritation, pt continues to show higher pain levels and reduction in mobility. Cryotherapy applied at EOS, will not discharge patient yet and will hold until decision from podiatrist is made regarding next steps. Pt will benefit from skilled Physical Therapy services to address deficits/limitations in order to improve functional and QOL.        Eval: Patient is a 43 y.o. female who was seen today for physical therapy evaluation and treatment for M76.71 (ICD-10-CM) - Peroneal tendinitis, right. Pt demonstrates decreased gait speed and pattern (decreased speed and stride lengths). Pt is motivated and is willing to comply with all suggested therapy to the best of her ability. Pt demonstrates decreased ROM and strength on RLE compared to the LLE due to pain during assessment. Patient would benefit from skilled physical therapy to decrease pain of RLE, increase R ankle  ROM and strength to improve quality of life, community ambulation and progression towards therapy goals.   OBJECTIVE IMPAIRMENTS: Abnormal gait, decreased activity tolerance, decreased balance, decreased endurance, decreased mobility, difficulty walking, decreased ROM, decreased strength, and pain.   ACTIVITY LIMITATIONS: carrying, lifting, bending, sitting, standing, squatting, stairs, transfers, and dressing  PARTICIPATION LIMITATIONS: meal prep, cleaning, community activity, and occupation  PERSONAL FACTORS: Profession are also affecting patient's functional outcome.   REHAB POTENTIAL: Good  CLINICAL DECISION MAKING: Stable/uncomplicated  EVALUATION COMPLEXITY: Low   GOALS: Goals reviewed with patient? Yes  SHORT TERM GOALS: Target date: 04/26/2023  Patient will  demonstrate evidence of independence with individualized HEP and will report compliance for at least 3 days per week for optimized progression towards remaining therapy goals. Baseline:  Goal status: MET  2.  Patient will report a decrease in pain level during community ambulation by at least 2 points for improved quality of life. Baseline: 7/10 Goal status: IN PROGRESS     LONG TERM GOALS: Target date: 05/17/2023  Pt will demonstrate a an increase of at least 9 points on the LEFS for improved performance of community ambulation and ADL. Baseline: 39/80 Goal status: IN PROGRESS  2.  Pt will improve 2 MWT by 100 feet in order to demonstrate improved functional ambulatory capacity in community setting.  Baseline: 378 feet (age norm=600 feet) Goal status: IN PROGRESS  3.  Pt will demonstrate at least 4-/5 MMT for right ankle for increased strength during ADL and community ambulation. Baseline: See above Goal status: IN PROGRESS  4.  Pt will report a pain level of at most 3/10 with ADLs and community ambulation for improved quality of life. Baseline: 7/10 Goal status: IN PROGRESS  PLAN:  PT FREQUENCY: 2x/week  PT DURATION: 6 weeks  PLANNED INTERVENTIONS: 97110-Therapeutic exercises, 97530- Therapeutic activity, [id number]- Neuromuscular re-education, 97535- Self Care, 91478- Manual therapy, [phone]- Gait training, Patient/Family education, Balance training, and Stair training  PLAN FOR NEXT SESSION: Continue with therapy pending appt with MD and further orders.  Continue with Rt LE strengthening, balance and intrinsic musculature training for Rt foot.   Gatha Kaska, PT 05/26/2023, 4:00 PM

## 2023-06-02 ENCOUNTER — Ambulatory Visit (INDEPENDENT_AMBULATORY_CARE_PROVIDER_SITE_OTHER): Admitting: Podiatry

## 2023-06-02 DIAGNOSIS — M19071 Primary osteoarthritis, right ankle and foot: Secondary | ICD-10-CM | POA: Diagnosis not present

## 2023-06-02 DIAGNOSIS — M7671 Peroneal tendinitis, right leg: Secondary | ICD-10-CM

## 2023-06-02 DIAGNOSIS — M7751 Other enthesopathy of right foot: Secondary | ICD-10-CM

## 2023-06-02 MED ORDER — DICLOFENAC SODIUM 75 MG PO TBEC: 75.0000 mg | DELAYED_RELEASE_TABLET | Freq: Two times a day (BID) | ORAL | 1 refills | Status: AC

## 2023-06-02 MED ORDER — PREDNISONE 10 MG PO TABS
ORAL_TABLET | ORAL | 0 refills | Status: AC
Start: 2023-06-02 — End: 2023-06-09

## 2023-06-05 NOTE — Progress Notes (Signed)
  Subjective:  Patient ID: [name], female    DOB: 1980-05-01,  MRN: [id number]  Chief Complaint  Patient presents with   Foot Pain    Follow up. Peroneal tendinitis right foot. * pain. P/T not helping. Non diabetic.   She says that it is still painful for her   Objective:    Physical Exam   EXTREMITIES: Right foot exhibits warmth, good capillary fill time, palpable dorsalis pedis and posterior tibial pulses. MUSCULOSKELETAL: Today pain at the dorsum of the peroneus brevis on the fifth metatarsal and into the peroneal sulcus rating into the plantar forefoot mild pain in the fourth dorsal interspace and at the tarsometatarsal joint      Right foot MRI 02/04/2023  Study Result  Narrative & Impression  CLINICAL DATA:  Chronic foot pain and numbness since last summer. Pain mostly around the base of the fourth and fifth metatarsals. No injury or prior surgery.   EXAM: MRI OF THE RIGHT FOREFOOT WITHOUT CONTRAST   TECHNIQUE: Multiplanar, multisequence MR imaging of the right foot was performed. No intravenous contrast was administered.   COMPARISON:  Right foot x-rays dated December 07, 2022.   FINDINGS: Bones/Joint/Cartilage   No marrow signal abnormality. No fracture or dislocation. Joint spaces are preserved. No joint effusion.   Ligaments   Collateral ligaments are intact.  Lisfranc ligament is intact.   Muscles and Tendons Flexor and extensor tendons are intact. No tenosynovitis. No muscle edema or atrophy.   Soft tissue No fluid collection or hematoma.  No soft tissue mass.   IMPRESSION: 1. Normal MRI of the right foot.     Electronically Signed   By: Aleta Anda M.D.   On: 02/12/2023 20:08   RADIOLOGY Right foot X-ray: No acute fracture, no stress fracture, no major arthritic changes, os perineum accessory ossicle at the peroneal sulcus (12/06/2022)      Assessment:   Encounter Diagnoses  Name Primary?   Bursitis of intermetatarsal bursa of  right foot Yes   Peroneal tendinitis, right    Arthritis of right midfoot      Plan:  Patient was evaluated and treated and all questions answered.  Assessment and Plan     Still has had minimal improvement.  I recommend she return to her boot for another 4 to 6 weeks for further rest I placed her on a prednisone  taper followed by diclofenac  twice daily 75 mg.  Discussed if not improving in 6 to 8 weeks I would recommend following up with an ankle MRI and/or ultrasound.  Return in about 8 weeks (around 07/28/2023) for right foot pain f/u , re-check peroneal tendinitis.

## 2023-06-08 ENCOUNTER — Ambulatory Visit: Payer: Self-pay

## 2023-06-08 VITALS — BP 122/76 | HR 90 | Ht 66.0 in | Wt 207.1 lb

## 2023-06-08 DIAGNOSIS — I10 Essential (primary) hypertension: Secondary | ICD-10-CM | POA: Diagnosis not present

## 2023-06-08 DIAGNOSIS — R600 Localized edema: Secondary | ICD-10-CM

## 2023-06-08 MED ORDER — HYDROCHLOROTHIAZIDE 12.5 MG PO TABS: 12.5000 mg | ORAL_TABLET | Freq: Every day | ORAL | 1 refills | Status: AC

## 2023-06-08 NOTE — Progress Notes (Signed)
 Established Patient Office Visit  Subjective   Patient ID: [name], female    DOB: [birth year]  Age: 43 y.o. MRN: [id number]  Chief Complaint  Patient presents with   Medical Management of Chronic Issues    Pt has bp concerns    HPI Hypertension: Patient is here for evaluation of elevated blood pressures. Cardiac symptoms none. Patient denies none.  Cardiovascular risk factors: family history of premature cardiovascular disease, obesity (BMI >= 30 kg/m2), and sedentary lifestyle. Use of agents associated with hypertension: none. History of target organ damage: none.  Patient Active Problem List   Diagnosis Date Noted   Vitamin D  deficiency 06/02/2022   Breast lump on left side at 6 o'clock position 03/12/2022   Breast pain, left 03/12/2022   Encounter for annual physical exam 11/17/2021   Celiac disease 07/13/2021   IBS (irritable bowel syndrome) 03/12/2021   Constipation 02/03/2021   Degenerative tear of lateral meniscus of right knee 07/06/2019   Snoring 07/21/2015   GERD (gastroesophageal reflux disease) 03/12/2013   Seasonal and perennial allergic rhinitis 03/12/2013   Chronic hoarseness 03/12/2013   Obesity (BMI 30.0-34.9) 06/06/2008   Past Medical History:  Diagnosis Date   Anxiety    Hoarseness, chronic 03/12/2013   No pertinent past medical history       ROS    Objective:     BP 122/76   Pulse 90   Ht 5\' 6"  (1.676 m)   Wt 207 lb 1.3 oz (93.9 kg)   LMP 11/21/2011   SpO2 94%   BMI 33.42 kg/m  BP Readings from Last 3 Encounters:  06/08/23 122/76  06/01/22 121/74  03/12/22 128/81   Wt Readings from Last 3 Encounters:  06/08/23 207 lb 1.3 oz (93.9 kg)  12/07/22 210 lb (95.3 kg)  06/01/22 203 lb (92.1 kg)      Physical Exam Vitals and nursing note reviewed.  Constitutional:      Appearance: Normal appearance.  Eyes:     Extraocular Movements: Extraocular movements intact.     Pupils: Pupils are equal, round, and reactive to light.   Cardiovascular:     Rate and Rhythm: Normal rate and regular rhythm.  Pulmonary:     Effort: Pulmonary effort is normal.     Breath sounds: Normal breath sounds.  Neurological:     Mental Status: She is alert and oriented to person, place, and time.  Psychiatric:        Mood and Affect: Mood normal.        Thought Content: Thought content normal.      No results found for any visits on 06/08/23.  Last CBC Lab Results  Component Value Date   WBC 5.8 05/31/2022   HGB 12.3 05/31/2022   HCT 36.6 05/31/2022   MCV 92 05/31/2022   MCH 31.0 05/31/2022   RDW 12.8 05/31/2022   PLT 261 05/31/2022   Last metabolic panel Lab Results  Component Value Date   GLUCOSE 69 (L) 05/31/2022   NA 136 05/31/2022   K 3.8 05/31/2022   CL 101 05/31/2022   CO2 20 05/31/2022   BUN 9 05/31/2022   CREATININE 0.78 05/31/2022   EGFR 97 05/31/2022   CALCIUM 9.1 05/31/2022   PROT 6.5 05/31/2022   ALBUMIN 4.0 05/31/2022   LABGLOB 2.5 05/31/2022   AGRATIO 1.6 05/31/2022   BILITOT 0.7 05/31/2022   ALKPHOS 55 05/31/2022   AST 26 05/31/2022   ALT 10 05/31/2022   Last lipids Lab Results  Component Value Date   CHOL 126 05/31/2022   HDL 64 05/31/2022   LDLCALC 51 05/31/2022   TRIG 47 05/31/2022   CHOLHDL 2.0 05/31/2022   Last hemoglobin A1c Lab Results  Component Value Date   HGBA1C 5.2 07/21/2015   Last thyroid  functions Lab Results  Component Value Date   TSH 1.520 05/31/2022   Last vitamin D  Lab Results  Component Value Date   VD25OH 19.5 (L) 05/31/2022      The ASCVD Risk score (Arnett DK, et al., 2019) failed to calculate for the following reasons:   The valid total cholesterol range is 130 to 320 mg/dL    Assessment & Plan:   Problem List Items Addressed This Visit   None Visit Diagnoses       Elevated blood pressure reading in office with diagnosis of hypertension    -  Primary   likely r/t inactivity and weight gain associated with right foot injury.  will add  hctz for now.  recommend DASH diet and increased physical acitivty.   Relevant Medications   hydrochlorothiazide (HYDRODIURIL) 12.5 MG tablet     Localized edema       add low dose hctz for continued edema.  continue to wear compression socks.   Relevant Medications   hydrochlorothiazide (HYDRODIURIL) 12.5 MG tablet       Return in about 6 weeks (around 07/20/2023).    Alison Irvine, FNP

## 2023-06-08 NOTE — Patient Instructions (Signed)
 F/u in 4-6 weeks to recheck blood pressure

## 2023-07-21 ENCOUNTER — Ambulatory Visit: Admitting: Family Medicine

## 2023-09-13 IMAGING — MG MM DIGITAL SCREENING BILAT W/ TOMO AND CAD
8 series · 9 of 24 positions shown · non-contrast
Comparison: Previous exam(s).

CLINICAL DATA: Screening.

EXAM:
DIGITAL SCREENING BILATERAL MAMMOGRAM WITH TOMOSYNTHESIS AND CAD
TECHNIQUE: Bilateral screening digital craniocaudal and mediolateral oblique
mammograms were obtained. Bilateral screening digital breast
tomosynthesis was performed. The images were evaluated with
computer-aided detection.

[L MLO synth-2D]
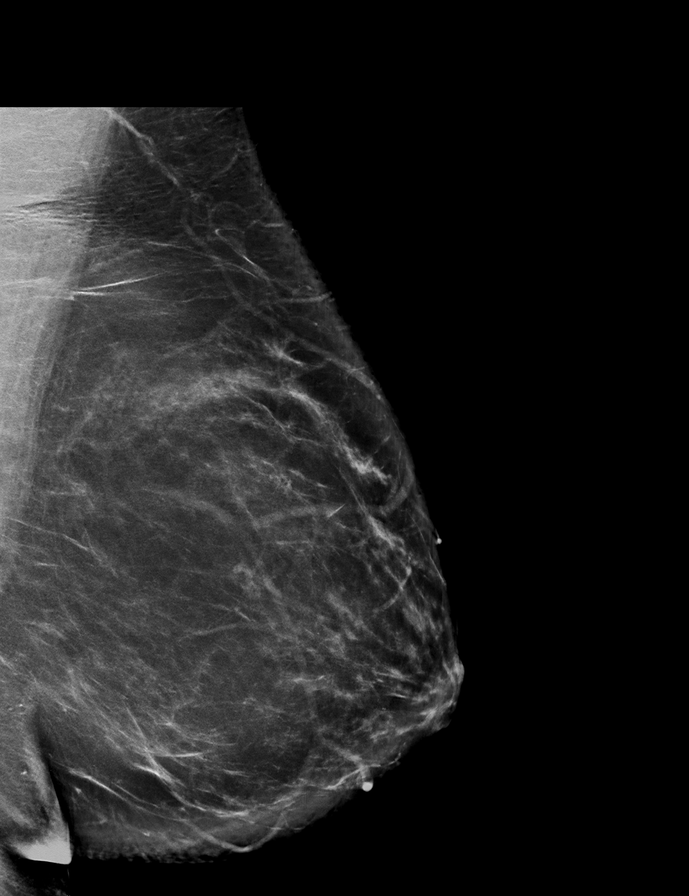

[L CC synth-2D]
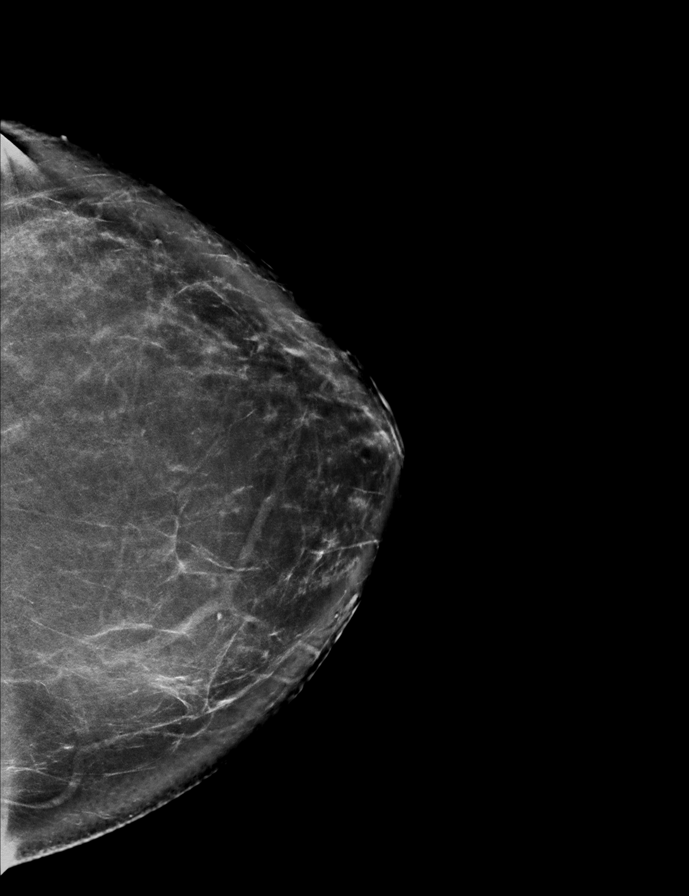

[R MLO synth-2D]
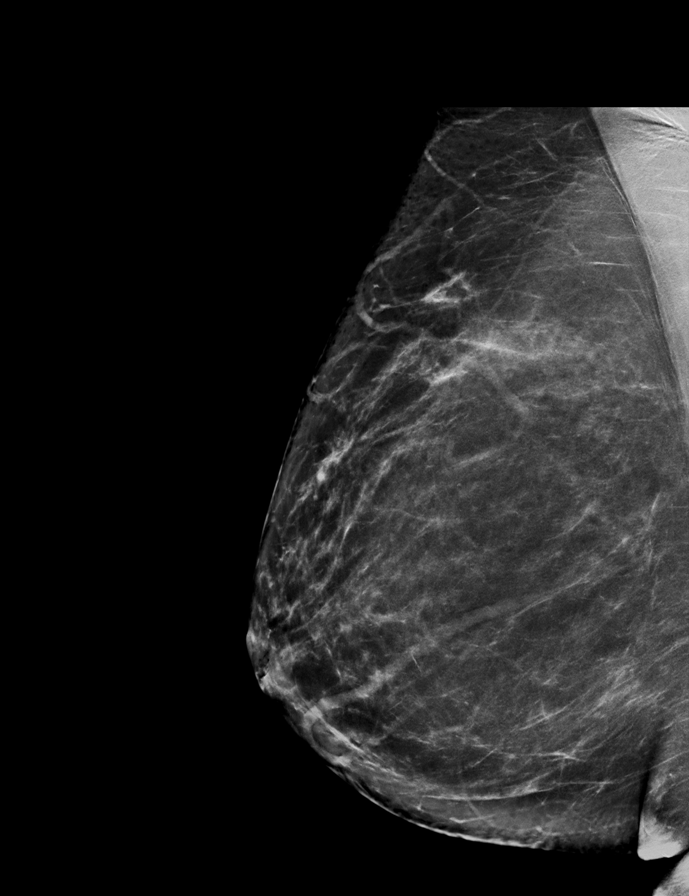

[R CC synth-2D]
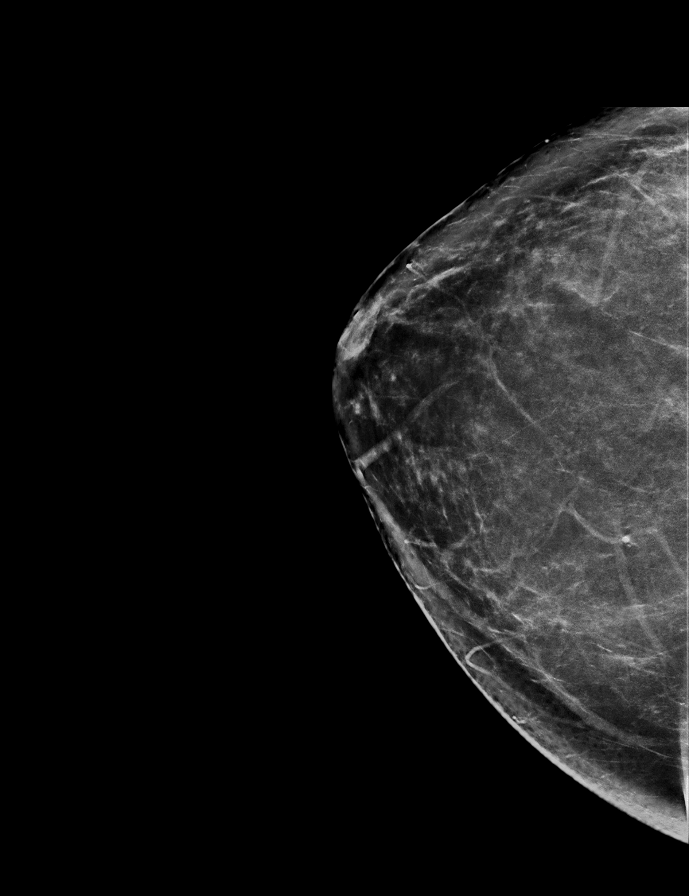

[R MLO tomo · 2 of 90 frames shown]
[frame 29/90]
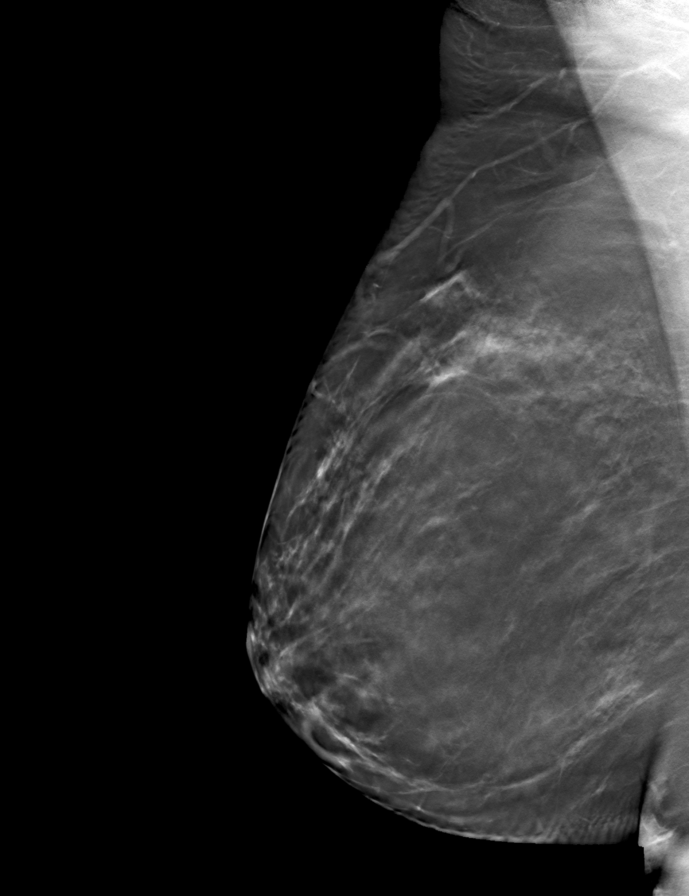
[frame 45/90]
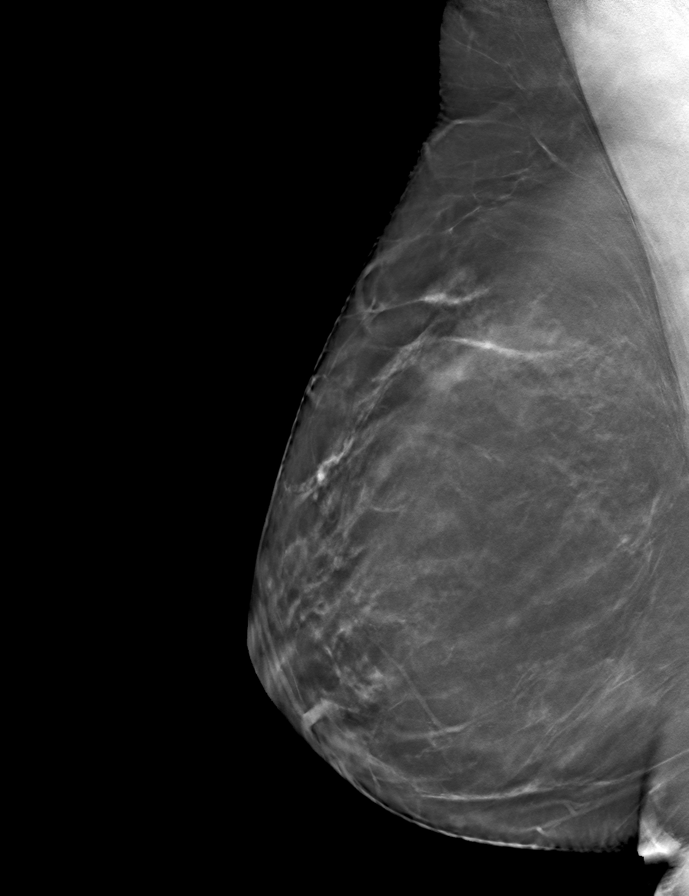

[L CC tomo · tomo slice 47/92.0]
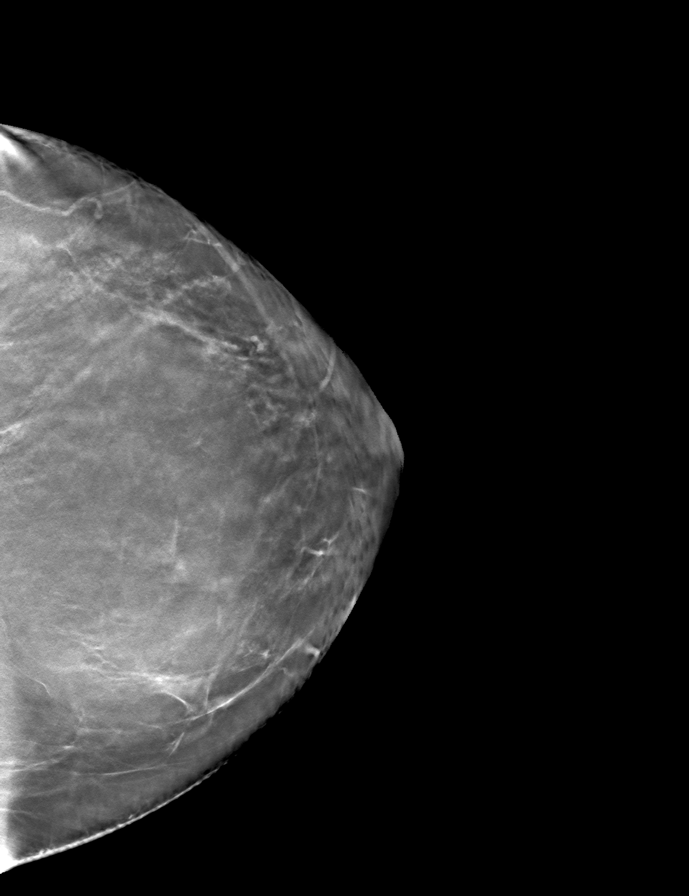

[R CC tomo · tomo slice 43/84.0]
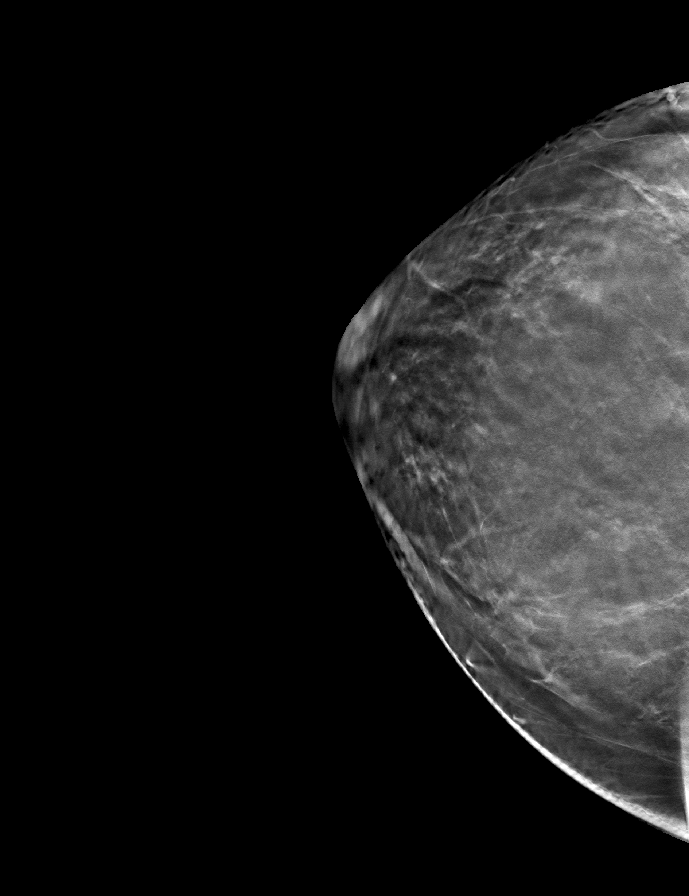

[L MLO tomo · tomo slice 47/94.0]
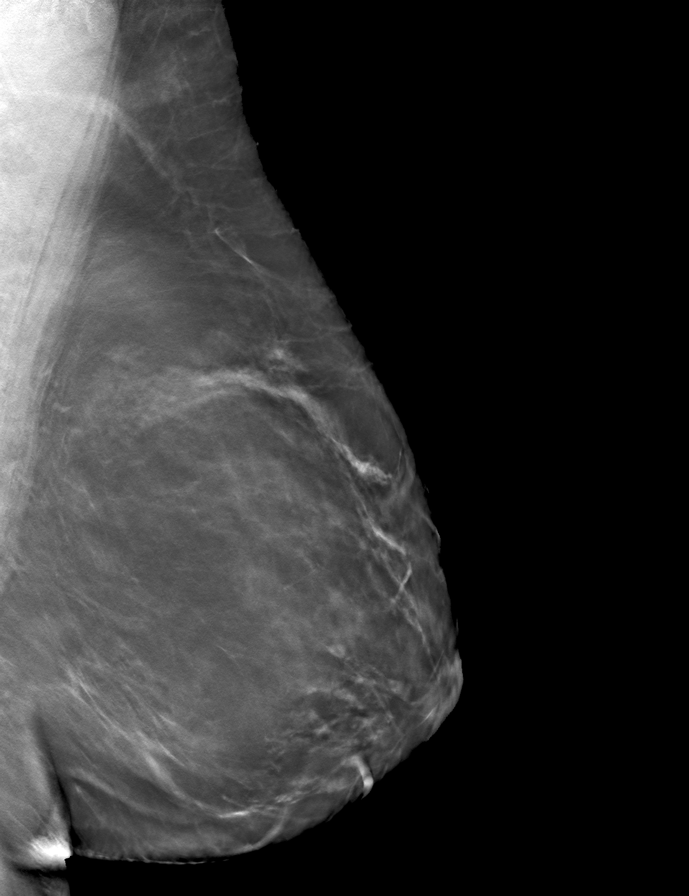

[9 of 24 positions shown; findings below may reference images not displayed]

ACR Breast Density Category b: There are scattered areas of
fibroglandular density.
FINDINGS: There are no findings suspicious for malignancy.
IMPRESSION: No mammographic evidence of malignancy. A result letter of this
screening mammogram will be mailed directly to the patient.

RECOMMENDATION:
Screening mammogram in one year. (Code:51-O-LD2)

BI-RADS CATEGORY  1: Negative.

## 2023-11-16 ENCOUNTER — Encounter (INDEPENDENT_AMBULATORY_CARE_PROVIDER_SITE_OTHER): Payer: Self-pay | Admitting: Gastroenterology

## 2023-11-25 ENCOUNTER — Other Ambulatory Visit (HOSPITAL_COMMUNITY): Payer: Self-pay | Admitting: Family Medicine

## 2023-11-25 ENCOUNTER — Ambulatory Visit (HOSPITAL_COMMUNITY)
Admission: RE | Admit: 2023-11-25 | Discharge: 2023-11-25 | Disposition: A | Discharge status: Home / Self Care | Source: Ambulatory Visit

## 2023-11-25 DIAGNOSIS — Z1231 Encounter for screening mammogram for malignant neoplasm of breast: Secondary | ICD-10-CM

## 2023-11-30 ENCOUNTER — Ambulatory Visit: Payer: Self-pay

## 2024-03-19 ENCOUNTER — Ambulatory Visit: Admitting: Orthopedic Surgery
